# Patient Record
Sex: Female | Born: 1980 | Race: White | Hispanic: No | Smoking: Current every day smoker
Health system: Southern US, Community
[De-identification: ages and names within clinical notes are randomized; demographics above are authoritative.]

## PROBLEM LIST (undated history)

## (undated) DIAGNOSIS — N189 Chronic kidney disease, unspecified: Secondary | ICD-10-CM

## (undated) DIAGNOSIS — T7840XA Allergy, unspecified, initial encounter: Secondary | ICD-10-CM

## (undated) DIAGNOSIS — O1490 Unspecified pre-eclampsia, unspecified trimester: Secondary | ICD-10-CM

## (undated) HISTORY — PX: TOOTH EXTRACTION: SUR596

## (undated) HISTORY — DX: Allergy, unspecified, initial encounter: T78.40XA

## (undated) HISTORY — DX: Unspecified pre-eclampsia, unspecified trimester: O14.90

## (undated) HISTORY — DX: Chronic kidney disease, unspecified: N18.9

---
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
## 2003-10-12 ENCOUNTER — Other Ambulatory Visit: Admission: RE | Admit: 2003-10-12 | Discharge: 2003-10-12 | Payer: Self-pay | Admitting: Obstetrics and Gynecology

## 2006-10-31 ENCOUNTER — Other Ambulatory Visit: Admission: RE | Admit: 2006-10-31 | Discharge: 2006-10-31 | Payer: Self-pay | Admitting: *Deleted

## 2007-01-10 DIAGNOSIS — N189 Chronic kidney disease, unspecified: Secondary | ICD-10-CM

## 2007-01-10 HISTORY — DX: Chronic kidney disease, unspecified: N18.9

## 2008-01-07 ENCOUNTER — Encounter: Admission: RE | Admit: 2008-01-07 | Discharge: 2008-01-07 | Payer: Self-pay | Admitting: Nephrology

## 2008-04-09 ENCOUNTER — Inpatient Hospital Stay (HOSPITAL_COMMUNITY): Admission: AD | Admit: 2008-04-09 | Discharge: 2008-04-09 | Payer: Self-pay | Admitting: Obstetrics & Gynecology

## 2008-04-11 ENCOUNTER — Inpatient Hospital Stay (HOSPITAL_COMMUNITY): Admission: AD | Admit: 2008-04-11 | Discharge: 2008-04-11 | Payer: Self-pay | Admitting: Obstetrics

## 2008-04-16 ENCOUNTER — Inpatient Hospital Stay (HOSPITAL_COMMUNITY): Admission: AD | Admit: 2008-04-16 | Discharge: 2008-04-16 | Payer: Self-pay | Admitting: Obstetrics

## 2008-04-17 ENCOUNTER — Inpatient Hospital Stay (HOSPITAL_COMMUNITY): Admission: AD | Admit: 2008-04-17 | Discharge: 2008-04-17 | Payer: Self-pay | Admitting: Obstetrics

## 2008-04-23 ENCOUNTER — Inpatient Hospital Stay (HOSPITAL_COMMUNITY): Admission: AD | Admit: 2008-04-23 | Discharge: 2008-04-26 | Payer: Self-pay | Admitting: Obstetrics & Gynecology

## 2008-05-13 ENCOUNTER — Inpatient Hospital Stay (HOSPITAL_COMMUNITY): Admission: AD | Admit: 2008-05-13 | Discharge: 2008-05-16 | Payer: Self-pay | Admitting: Obstetrics and Gynecology

## 2009-08-29 IMAGING — US US RENAL
1 series · 14 of 25 positions shown · non-contrast
Comparison: None

CLINICAL DATA: Proteinuria

RENAL/URINARY TRACT ULTRASOUND
TECHNIQUE: Complete ultrasound examination of the urinary tract
was performed including evaluation of the kidneys, renal collecting
systems, and urinary bladder.

[Series 1: us renal · 0.22mm/px · 14 of 34 slices shown]
[im 1/34]
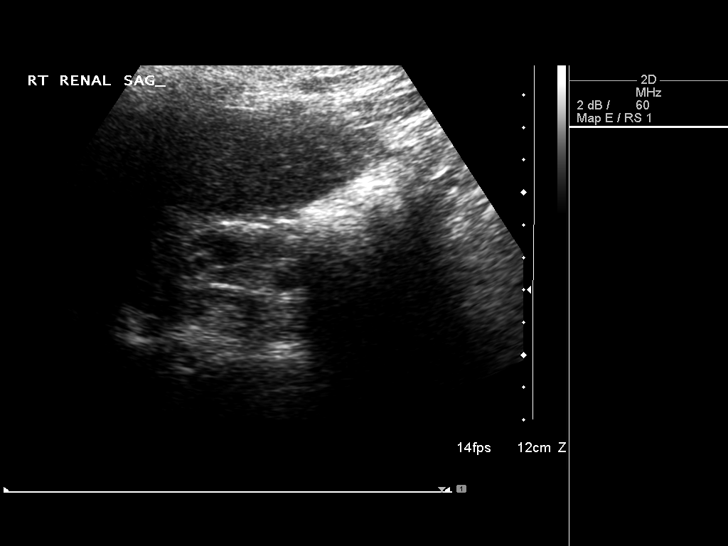
[im 3/34]
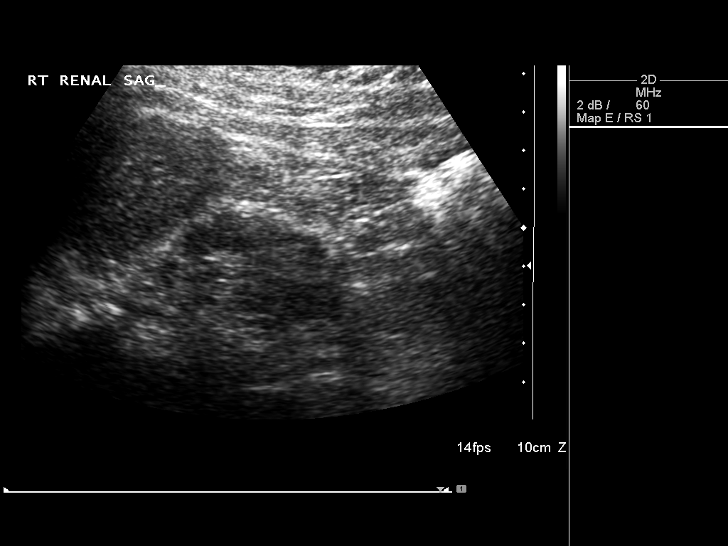
[im 6/34]
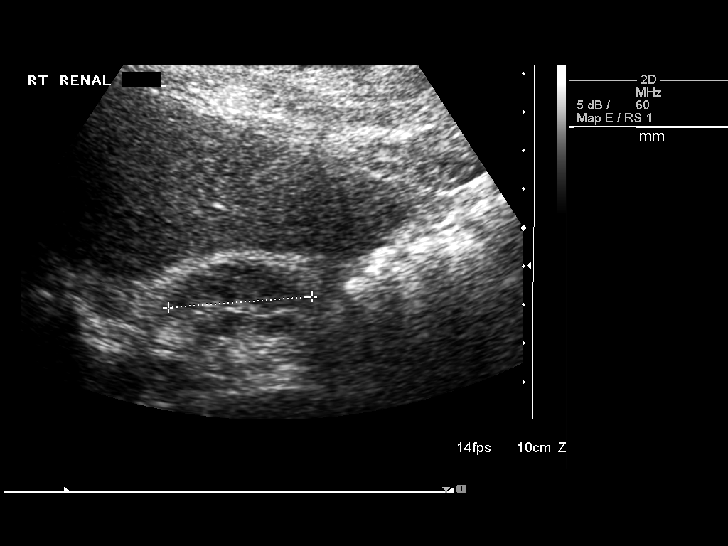
[im 9/34]
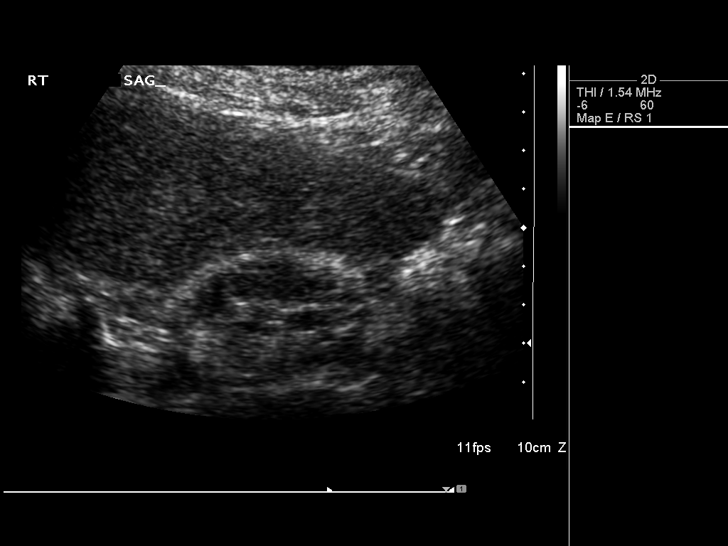
[im 12/34]
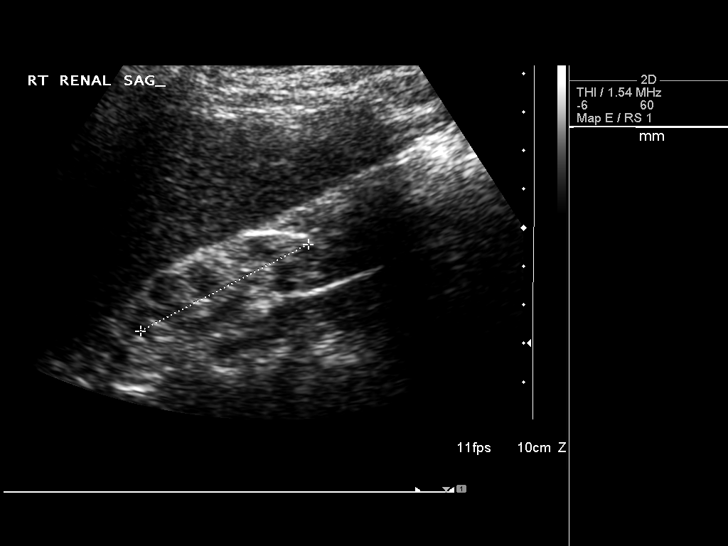
[im 13/34]
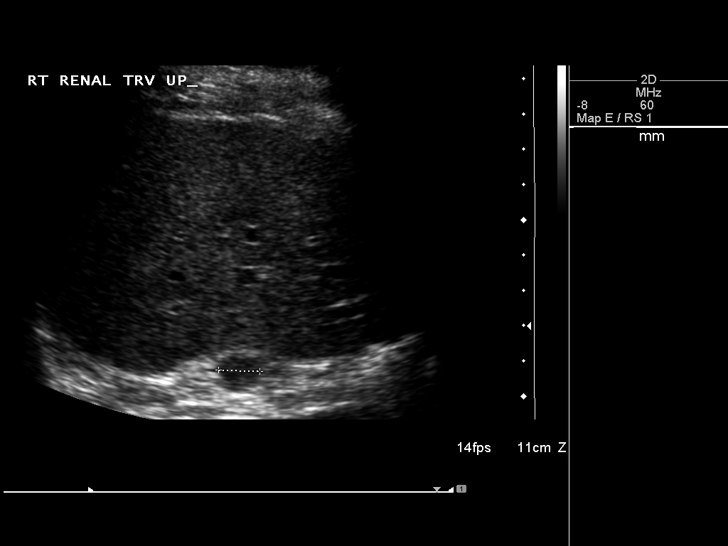
[im 16/34]
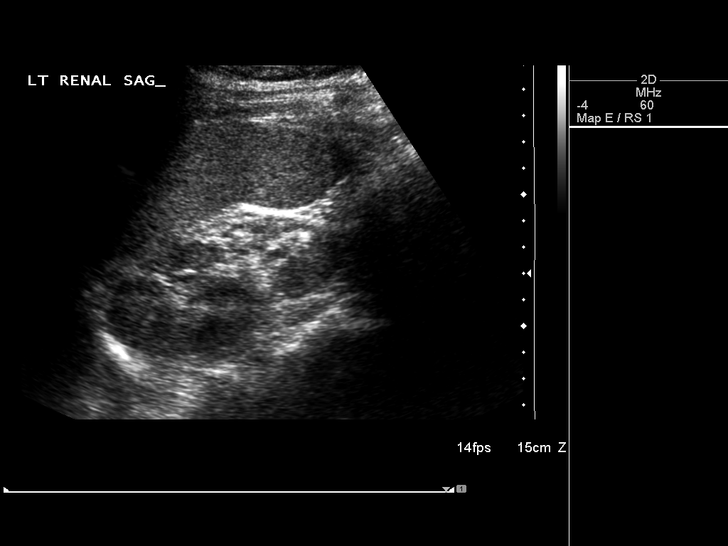
[im 18/34]
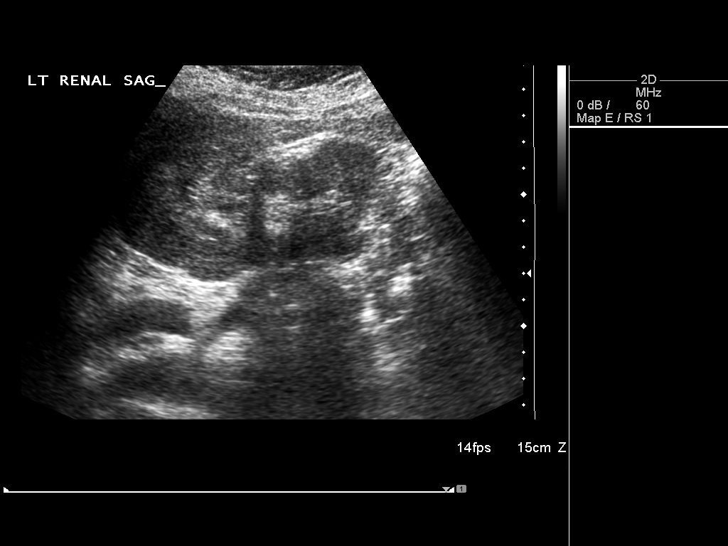
[im 21/34]
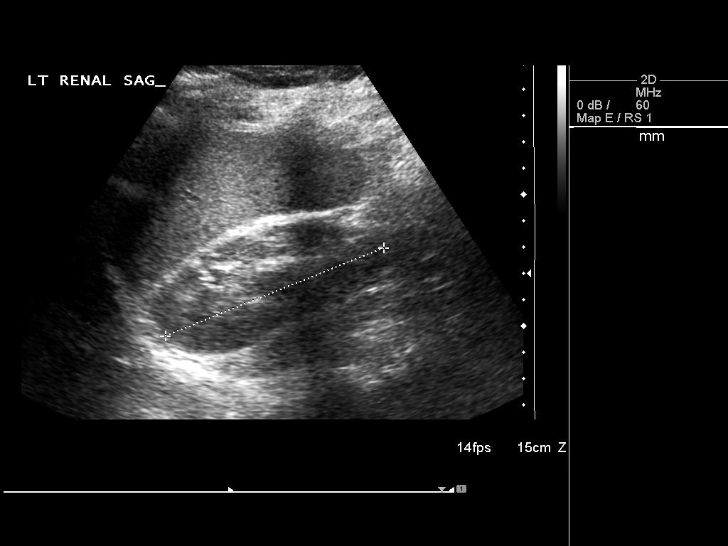
[im 23/34]
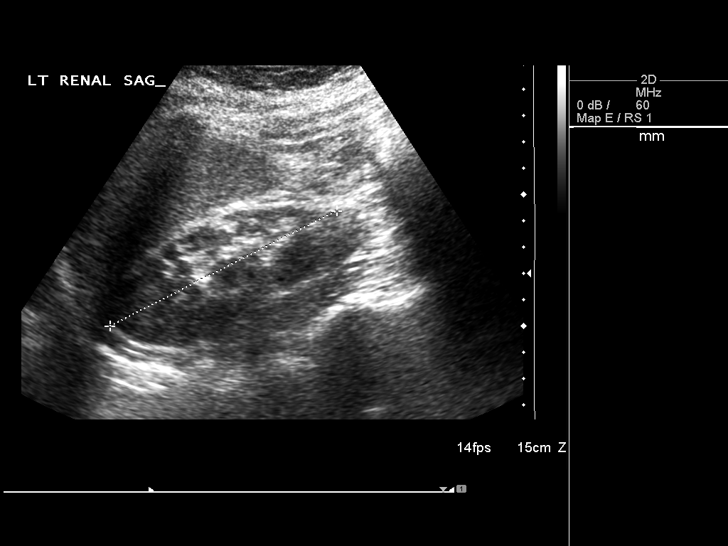
[im 25/34]
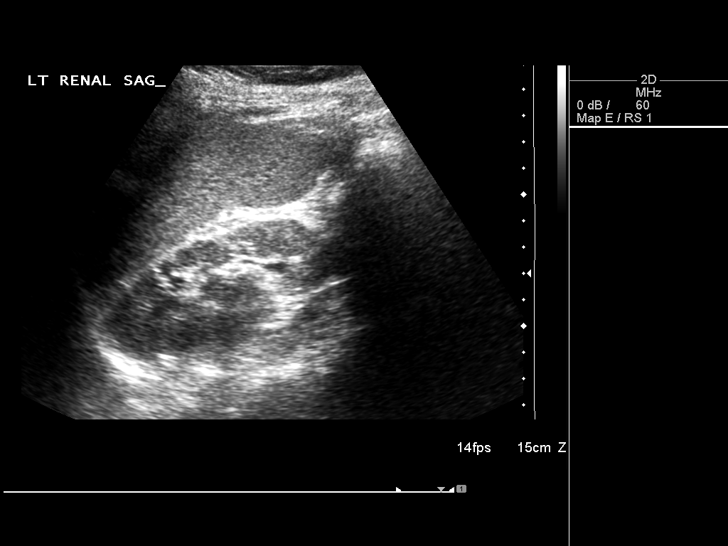
[im 28/34]
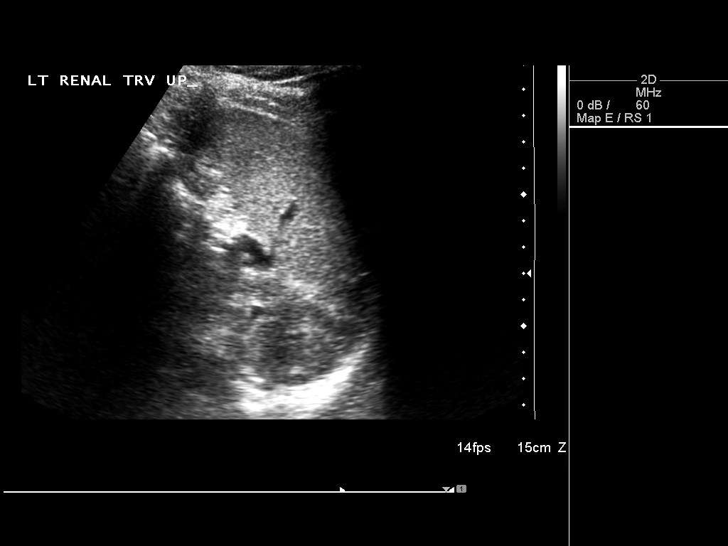
[im 31/34]
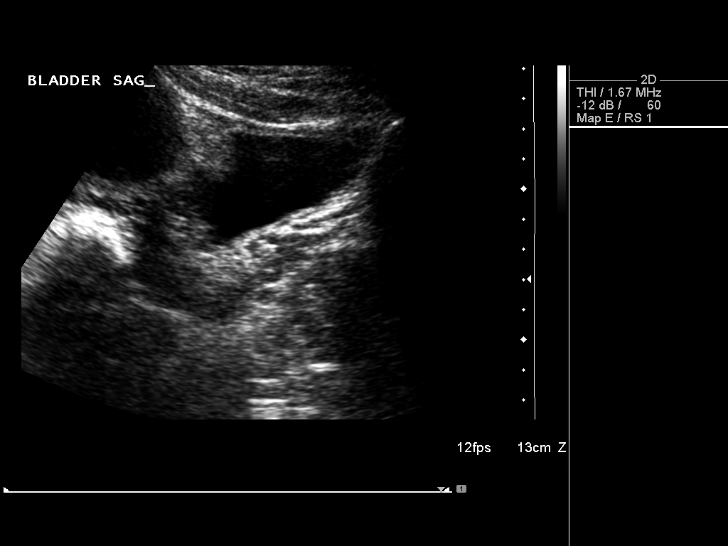
[im 34/34]
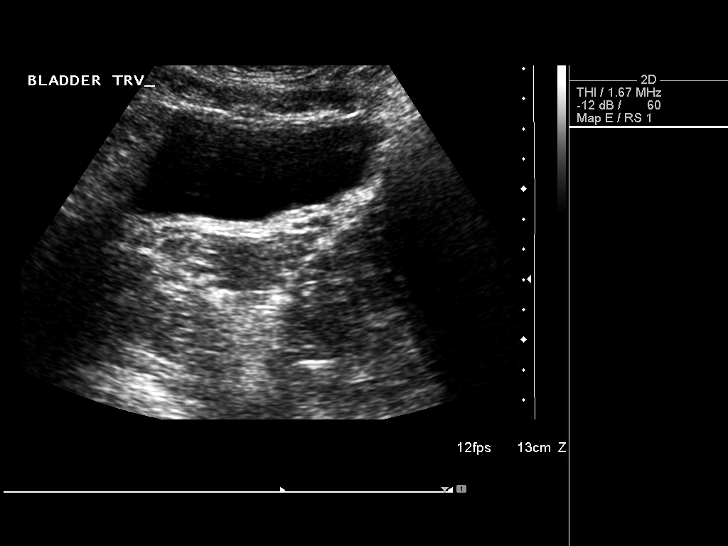

[14 of 25 positions shown; findings below may reference images not displayed]

FINDINGS: The kidneys appear very small and echogenic consistent
with chronic renal medical disease and atrophy.  The right kidney
measures 4.9 cm sagittally with left kidney measuring 9.0 cm.  No
hydronephrosis is seen.  A cyst is noted in the upper pole of the
right kidney of 1.1 x 1.1 x 1.2 cm.  The urinary bladder is
unremarkable although not well distended.
IMPRESSION: Small and echogenic kidneys consistent with chronic renal medical
disease and atrophy as noted above.

## 2010-04-19 LAB — COMPREHENSIVE METABOLIC PANEL
Alkaline Phosphatase: 110 U/L (ref 39–117)
Alkaline Phosphatase: 83 U/L (ref 39–117)
BUN: 13 mg/dL (ref 6–23)
BUN: 15 mg/dL (ref 6–23)
CO2: 23 mEq/L (ref 19–32)
Chloride: 105 mEq/L (ref 96–112)
Creatinine, Ser: 1.26 mg/dL — ABNORMAL HIGH (ref 0.4–1.2)
GFR calc non Af Amer: 51 mL/min — ABNORMAL LOW (ref >60)
GFR calc non Af Amer: 55 mL/min — ABNORMAL LOW (ref >60)
Glucose, Bld: 71 mg/dL (ref 70–99)
Glucose, Bld: 83 mg/dL (ref 70–99)
Potassium: 4.6 mEq/L (ref 3.5–5.1)
Total Bilirubin: 0.1 mg/dL — ABNORMAL LOW (ref 0.3–1.2)
Total Protein: 5.4 g/dL — ABNORMAL LOW (ref 6.0–8.3)

## 2010-04-19 LAB — CBC
HCT: 27.9 % — ABNORMAL LOW (ref 36.0–46.0)
HCT: 32.7 % — ABNORMAL LOW (ref 36.0–46.0)
Hemoglobin: 11.5 g/dL — ABNORMAL LOW (ref 12.0–15.0)
Hemoglobin: 9.9 g/dL — ABNORMAL LOW (ref 12.0–15.0)
MCHC: 35.3 g/dL (ref 30.0–36.0)
MCHC: 35.5 g/dL (ref 30.0–36.0)
MCV: 95.9 fL (ref 78.0–100.0)
MCV: 96.2 fL (ref 78.0–100.0)
Platelets: 209 10*3/uL (ref 150–400)
RBC: 2.91 MIL/uL — ABNORMAL LOW (ref 3.87–5.11)
RBC: 3.22 MIL/uL — ABNORMAL LOW (ref 3.87–5.11)
RBC: 3.54 MIL/uL — ABNORMAL LOW (ref 3.87–5.11)
RDW: 13.6 % (ref 11.5–15.5)
RDW: 13.9 % (ref 11.5–15.5)
WBC: 13.3 10*3/uL — ABNORMAL HIGH (ref 4.0–10.5)
WBC: 15.8 10*3/uL — ABNORMAL HIGH (ref 4.0–10.5)

## 2010-04-19 LAB — RH IMMUNE GLOB WKUP(>/=20WKS)(NOT WOMEN'S HOSP): Fetal Screen: NEGATIVE

## 2010-04-19 LAB — LACTATE DEHYDROGENASE: LDH: 147 U/L (ref 94–250)

## 2010-04-19 LAB — RPR: RPR Ser Ql: NONREACTIVE

## 2010-04-20 LAB — CBC
HCT: 32.1 % — ABNORMAL LOW (ref 36.0–46.0)
Hemoglobin: 10.6 g/dL — ABNORMAL LOW (ref 12.0–15.0)
Hemoglobin: 11 g/dL — ABNORMAL LOW (ref 12.0–15.0)
MCHC: 34.3 g/dL (ref 30.0–36.0)
MCHC: 35.1 g/dL (ref 30.0–36.0)
MCV: 95.1 fL (ref 78.0–100.0)
MCV: 96.1 fL (ref 78.0–100.0)
MCV: 96.2 fL (ref 78.0–100.0)
Platelets: 159 10*3/uL (ref 150–400)
RBC: 3.17 MIL/uL — ABNORMAL LOW (ref 3.87–5.11)
RBC: 3.26 MIL/uL — ABNORMAL LOW (ref 3.87–5.11)
RBC: 3.38 MIL/uL — ABNORMAL LOW (ref 3.87–5.11)
WBC: 10.4 10*3/uL (ref 4.0–10.5)
WBC: 10.9 10*3/uL — ABNORMAL HIGH (ref 4.0–10.5)
WBC: 11.4 10*3/uL — ABNORMAL HIGH (ref 4.0–10.5)

## 2010-04-20 LAB — COMPREHENSIVE METABOLIC PANEL
ALT: 13 U/L (ref 0–35)
AST: 17 U/L (ref 0–37)
AST: 16 U/L (ref 0–37)
AST: 17 U/L (ref 0–37)
Albumin: 2.4 g/dL — ABNORMAL LOW (ref 3.5–5.2)
Albumin: 2.6 g/dL — ABNORMAL LOW (ref 3.5–5.2)
Alkaline Phosphatase: 88 U/L (ref 39–117)
BUN: 9 mg/dL (ref 6–23)
CO2: 22 mEq/L (ref 19–32)
CO2: 22 mEq/L (ref 19–32)
CO2: 25 mEq/L (ref 19–32)
Calcium: 8.3 mg/dL — ABNORMAL LOW (ref 8.4–10.5)
Calcium: 8.4 mg/dL (ref 8.4–10.5)
Chloride: 106 mEq/L (ref 96–112)
Chloride: 105 mEq/L (ref 96–112)
Chloride: 106 mEq/L (ref 96–112)
Creatinine, Ser: 1.24 mg/dL — ABNORMAL HIGH (ref 0.4–1.2)
Creatinine, Ser: 1.11 mg/dL (ref 0.4–1.2)
Creatinine, Ser: 1.12 mg/dL (ref 0.4–1.2)
Creatinine, Ser: 1.18 mg/dL (ref 0.4–1.2)
GFR calc Af Amer: >60 mL/min (ref >60)
GFR calc Af Amer: >60 mL/min (ref >60)
GFR calc non Af Amer: 52 mL/min — ABNORMAL LOW (ref >60)
GFR calc non Af Amer: 55 mL/min — ABNORMAL LOW (ref >60)
GFR calc non Af Amer: 59 mL/min — ABNORMAL LOW (ref >60)
Glucose, Bld: 113 mg/dL — ABNORMAL HIGH (ref 70–99)
Glucose, Bld: 77 mg/dL (ref 70–99)
Potassium: 4.2 mEq/L (ref 3.5–5.1)
Sodium: 133 mEq/L — ABNORMAL LOW (ref 135–145)
Total Bilirubin: 0.4 mg/dL (ref 0.3–1.2)
Total Bilirubin: 0.3 mg/dL (ref 0.3–1.2)
Total Bilirubin: 0.4 mg/dL (ref 0.3–1.2)

## 2010-04-20 LAB — URIC ACID: Uric Acid, Serum: 6.7 mg/dL (ref 2.4–7.0)

## 2010-04-20 LAB — LACTATE DEHYDROGENASE
LDH: 116 U/L (ref 94–250)
LDH: 137 U/L (ref 94–250)

## 2010-04-20 LAB — CREATININE CLEARANCE, URINE, 24 HOUR
Collection Interval-CRCL: 24 hours
Creatinine, Urine: 53.5 mg/dL
Creatinine: 1.12 mg/dL (ref 0.40–1.20)
Urine Total Volume-CRCL: 1500 mL

## 2010-04-20 LAB — PROTEIN, URINE, 24 HOUR
Collection Interval-UPROT: 24 hours
Collection Interval-UPROT: 24 hours
Protein, 24H Urine: 1545 mg/d — ABNORMAL HIGH (ref 50–100)
Protein, 24H Urine: 855 mg/d — ABNORMAL HIGH (ref 50–100)
Protein, Urine: 60 mg/dL
Urine Total Volume-UPROT: 1500 mL

## 2010-05-27 NOTE — Discharge Summary (Signed)
[name], [name]              ACCOUNT NO.:  [id number]   MEDICAL RECORD NO.:  [id number]          PATIENT TYPE:  INP   LOCATION:  9151                          FACILITY:  [facility]   PHYSICIAN:  Ala Dach, MD   DATE OF BIRTH:  1980-10-24   DATE OF ADMISSION:  04/23/2008  DATE OF DISCHARGE:  04/26/2008                               DISCHARGE SUMMARY   CHIEF COMPLAINT:  Preeclampsia.   HISTORY OF PRESENT ILLNESS:  This is a 30 year old G1 admitted at 33  weeks and 6 days for evaluation and monitoring of mild preeclampsia.  The patient does have a history of baseline mild renal disease and has  had progressive increase in proteinuria in her third trimester.  She  also has had mildly elevated blood pressure.  She does note headache  4/10 but has tried no p.o. pain medicines.  She does note occasional  blurry vision but no scotomata, no right upper quadrant pain.  She is  having good fetal movement, no leakage of fluid, and no vaginal  bleeding.  Her prenatal issues are significant for the chronic renal  disease status post her consultation with Dr. Florene Glen from nephrology.  Baseline 24-hour urine of 591 mg.  Renal ultrasound showing small  echogenic kidneys with atrophy, negative C3-C4, and a creatinine of  1.23.  Given her baseline renal disease, baseline PIH labs were done.  Over the 3rd trimester it was notes that her blood pressures has been  rising.  She was noted to have some preterm cervical change at 33 weeks.  Fetal fibronectin was negative, but she did receive betamethasone.  Fetal testing has showed reactive testing with appropriate growth.  She  was O negative status post RhoGAM at 28 weeks and will need rubella shot  postpartum.  Remainder of her past medical, surgical, and obstetrical  history is as per her admission H&P.   HOSPITAL COURSE:  The patient was admitted for maternal and fetal  monitoring given her proteinuria and blood pressures.  On admission, a  creatinine was noted to be 1.18 with the uric acid of 7.2.  On hospital  day #2, the patient did note some blurry vision.  She also met with  Social Work.  By hospital day #3, her headache had resolved, her blood  pressures were somewhat normalized with systolics in the [id number] to the  [id number] and diastolics 123456 to the high 80s.  Fetal testing remained  reactive throughout.  A repeat 24-hour urine was being collected while  she was in the hospital.  By hospital day #4, the patient was again  stable, vitals improved with rest.  Fetal ultrasound showed an AFI of  11.7, fetal NST remained reactive.  Her [place] labs were essentially stable  as far as her 24-hour urine and the decision was made to discharge the  patient home with twice weekly labs and fetal and maternal assessment as  an outpatient.  The patient was willing to comply with the  recommendations and was discharged home.   DISCHARGE DIAGNOSES:  1. Chronic renal disease.  2. Mild preeclampsia.   DISCHARGE  CONDITION:  Stable.   DISCHARGE DISPOSITION:  To home.   DISCHARGE MEDICATIONS:  Prenatal vitamins, Zantac as needed.      Ala Dach, MD  Electronically Signed     KAF/MEDQ  D:  06/24/2008  T:  06/25/2008  Job:  [id number]

## 2010-12-27 ENCOUNTER — Ambulatory Visit (INDEPENDENT_AMBULATORY_CARE_PROVIDER_SITE_OTHER): Payer: BC Managed Care – PPO

## 2010-12-27 DIAGNOSIS — R05 Cough: Secondary | ICD-10-CM

## 2010-12-27 DIAGNOSIS — G4700 Insomnia, unspecified: Secondary | ICD-10-CM

## 2010-12-27 DIAGNOSIS — F4321 Adjustment disorder with depressed mood: Secondary | ICD-10-CM

## 2010-12-27 DIAGNOSIS — M545 Low back pain: Secondary | ICD-10-CM

## 2016-02-17 ENCOUNTER — Ambulatory Visit (INDEPENDENT_AMBULATORY_CARE_PROVIDER_SITE_OTHER): Payer: BC Managed Care – PPO | Admitting: Emergency Medicine

## 2016-02-17 VITALS — BP 132/88 | HR 122 | Temp 99.6°F | Wt 161.8 lb

## 2016-02-17 DIAGNOSIS — R112 Nausea with vomiting, unspecified: Secondary | ICD-10-CM

## 2016-02-17 DIAGNOSIS — B349 Viral infection, unspecified: Secondary | ICD-10-CM

## 2016-02-17 DIAGNOSIS — M791 Myalgia, unspecified site: Secondary | ICD-10-CM

## 2016-02-17 LAB — POCT URINALYSIS DIP (MANUAL ENTRY)
Bilirubin, UA: NEGATIVE
GLUCOSE UA: NEGATIVE
Ketones, POC UA: NEGATIVE
LEUKOCYTES UA: NEGATIVE
NITRITE UA: NEGATIVE
Spec Grav, UA: 1.02
UROBILINOGEN UA: 0.2
pH, UA: 6

## 2016-02-17 LAB — POCT CBC
GRANULOCYTE PERCENT: 91.5 % — AB (ref 37–80)
HCT, POC: 44.4 % (ref 37.7–47.9)
Hemoglobin: 15.6 g/dL (ref 12.2–16.2)
LYMPH, POC: 0.5 — AB (ref 0.6–3.4)
MCH, POC: 32.3 pg — AB (ref 27–31.2)
MCHC: 35.1 g/dL (ref 31.8–35.4)
MCV: 92.2 fL (ref 80–97)
MID (cbc): 0.3 (ref 0–0.9)
MPV: 7.6 fL (ref 0–99.8)
POC Granulocyte: 8.7 — AB (ref 2–6.9)
POC LYMPH %: 5.7 % — AB (ref 10–50)
POC MID %: 2.8 %M (ref 0–12)
Platelet Count, POC: 148 10*3/uL (ref 142–424)
RBC: 4.82 M/uL (ref 4.04–5.48)
RDW, POC: 12.5 %
WBC: 9.5 10*3/uL (ref 4.6–10.2)

## 2016-02-17 LAB — POCT INFLUENZA A/B
INFLUENZA A, POC: NEGATIVE
INFLUENZA B, POC: NEGATIVE

## 2016-02-17 MED ORDER — OSELTAMIVIR PHOSPHATE 75 MG PO CAPS: 75.0000 mg | ORAL_CAPSULE | Freq: Two times a day (BID) | ORAL | 0 refills | Status: AC

## 2016-02-17 MED ORDER — HYDROCODONE-ACETAMINOPHEN 5-325 MG PO TABS: 1.0000 | ORAL_TABLET | Freq: Four times a day (QID) | ORAL | 0 refills | Status: DC | PRN

## 2016-02-17 MED ORDER — ONDANSETRON HCL 4 MG PO TABS: 4.0000 mg | ORAL_TABLET | Freq: Three times a day (TID) | ORAL | 0 refills | Status: DC | PRN

## 2016-02-17 NOTE — Patient Instructions (Addendum)
IF you received an x-ray today, you will receive an invoice from [facility]. Please contact [facility] at [phone] with questions or concerns regarding your invoice.   IF you received labwork today, you will receive an invoice from [facility]. Please contact [facility] at [phone] with questions or concerns regarding your invoice.   Our billing staff will not be able to assist you with questions regarding bills from these companies.  You will be contacted with the lab results as soon as they are available. The fastest way to get your results is to activate your My Chart account. Instructions are located on the last page of this paperwork. If you have not heard from [place] regarding the results in 2 weeks, please contact this office.      Viral Illness, Adult Viruses are tiny germs that can get into a person's body and cause illness. There are many different types of viruses, and they cause many types of illness. Viral illnesses can range from mild to severe. They can affect various parts of the body. Common illnesses that are caused by a virus include colds and the flu. Viral illnesses also include serious conditions such as HIV/AIDS (human immunodeficiency virus/acquired immunodeficiency syndrome). A few viruses have been linked to certain cancers. What are the causes? Many types of viruses can cause illness. Viruses invade cells in your body, multiply, and cause the infected cells to malfunction or die. When the cell dies, it releases more of the virus. When this happens, you develop symptoms of the illness, and the virus continues to spread to other cells. If the virus takes over the function of the cell, it can cause the cell to divide and grow out of control, as is the case when a virus causes cancer. Different viruses get into the body in different ways. You can get a virus by:  Swallowing food or water that is contaminated with the virus.  Breathing in droplets  that have been coughed or sneezed into the air by an infected person.  Touching a surface that has been contaminated with the virus and then touching your eyes, nose, or mouth.  Being bitten by an insect or animal that carries the virus.  Having sexual contact with a person who is infected with the virus.  Being exposed to blood or fluids that contain the virus, either through an open cut or during a transfusion. If a virus enters your body, your body's defense system (immune system) will try to fight the virus. You may be at higher risk for a viral illness if your immune system is weak. What are the signs or symptoms? Symptoms vary depending on the type of virus and the location of the cells that it invades. Common symptoms of the main types of viral illnesses include: Cold and flu viruses  Fever.  Headache.  Sore throat.  Muscle aches.  Nasal congestion.  Cough. Digestive system (gastrointestinal) viruses  Fever.  Abdominal pain.  Nausea.  Diarrhea. Liver viruses (hepatitis)  Loss of appetite.  Tiredness.  Yellowing of the skin (jaundice). Brain and spinal cord viruses  Fever.  Headache.  Stiff neck.  Nausea and vomiting.  Confusion or sleepiness. Skin viruses  Warts.  Itching.  Rash. Sexually transmitted viruses  Discharge.  Swelling.  Redness.  Rash. How is this treated? Viruses can be difficult to treat because they live within cells. Antibiotic medicines do not treat viruses because these drugs do not get inside cells. Treatment for a viral illness  may include:  Resting and drinking plenty of fluids.  Medicines to relieve symptoms. These can include over-the-counter medicine for pain and fever, medicines for cough or congestion, and medicines to relieve diarrhea.  Antiviral medicines. These drugs are available only for certain types of viruses. They may help reduce flu symptoms if taken early. There are also many antiviral medicines for  hepatitis and HIV/AIDS. Some viral illnesses can be prevented with vaccinations. A common example is the flu shot. Follow these instructions at home: Medicines  Take over-the-counter and prescription medicines only as told by your health care provider.  If you were prescribed an antiviral medicine, take it as told by your health care provider. Do not stop taking the medicine even if you start to feel better.  Be aware of when antibiotics are needed and when they are not needed. Antibiotics do not treat viruses. If your health care provider thinks that you may have a bacterial infection as well as a viral infection, you may get an antibiotic.  Do not ask for an antibiotic prescription if you have been diagnosed with a viral illness. That will not make your illness go away faster.  Frequently taking antibiotics when they are not needed can lead to antibiotic resistance. When this develops, the medicine no longer works against the bacteria that it normally fights. General instructions  Drink enough fluids to keep your urine clear or pale yellow.  Rest as much as possible.  Return to your normal activities as told by your health care provider. Ask your health care provider what activities are safe for you.  Keep all follow-up visits as told by your health care provider. This is important. How is this prevented? Take these actions to reduce your risk of viral infection:  Eat a healthy diet and get enough rest.  Wash your hands often with soap and water. This is especially important when you are in public places. If soap and water are not available, use hand sanitizer.  Avoid close contact with friends and family who have a viral illness.  If you travel to areas where viral gastrointestinal infection is common, avoid drinking water or eating raw food.  Keep your immunizations up to date. Get a flu shot every year as told by your health care provider.  Do not share toothbrushes, nail  clippers, razors, or needles with other people.  Always practice safe sex. Contact a health care provider if:  You have symptoms of a viral illness that do not go away.  Your symptoms come back after going away.  Your symptoms get worse. Get help right away if:  You have trouble breathing.  You have a severe headache or a stiff neck.  You have severe vomiting or abdominal pain. This information is not intended to replace advice given to you by your health care provider. Make sure you discuss any questions you have with your health care provider. Document Released: 05/07/2015 Document Revised: 06/09/2015 Document Reviewed: 05/07/2015 Elsevier Interactive Patient Education  2017 [employer].

## 2016-02-17 NOTE — Progress Notes (Signed)
[name] 36 y.o.   Chief Complaint  Patient presents with  . Emesis  . Chills  . Headache    HISTORY OF PRESENT ILLNESS: This is a 36 y.o. female complaining of nausea, vomiting, chills, and headache that started today 6am.  HPI   Prior to Admission medications   Not on File    No Known Allergies  There are no active problems to display for this patient.   History reviewed. No pertinent past medical history.  History reviewed. No pertinent surgical history.  Social History   Social History  . Marital status: Married    Spouse name: N/A  . Number of children: N/A  . Years of education: N/A   Occupational History  . Not on file.   Social History Main Topics  . Smoking status: Current Every Day Smoker    Packs/day: 0.25    Types: Cigarettes  . Smokeless tobacco: Never Used  . Alcohol use Not on file  . Drug use: Unknown  . Sexual activity: Not on file   Other Topics Concern  . Not on file   Social History Narrative  . No narrative on file    History reviewed. No pertinent family history.   Review of Systems  Constitutional: Positive for chills and malaise/fatigue. Negative for fever.  HENT: Negative for congestion, nosebleeds and sore throat.   Eyes: Negative for discharge and redness.  Respiratory: Negative for cough, shortness of breath and wheezing.   Cardiovascular: Negative for chest pain, palpitations and leg swelling.  Gastrointestinal: Positive for nausea and vomiting. Negative for abdominal pain, constipation and diarrhea.  Genitourinary: Negative for dysuria, flank pain and hematuria.  Musculoskeletal: Positive for back pain and myalgias. Negative for neck pain.  Skin: Negative for rash.  Neurological: Positive for headaches. Negative for dizziness, sensory change and focal weakness.  All other systems reviewed and are negative.  Vitals:   02/17/16 1513  BP: 132/88  Pulse: (!) 122  Temp: 99.6 F (37.6 C)     Physical  Exam  Constitutional: She is oriented to person, place, and time. She appears well-developed and well-nourished.  HENT:  Head: Normocephalic and atraumatic.  Nose: Nose normal.  Mouth/Throat: Oropharynx is clear and moist. No oropharyngeal exudate.  Eyes: Conjunctivae and EOM are normal. Pupils are equal, round, and reactive to light.  Neck: Normal range of motion. No JVD present. No thyromegaly present.  Cardiovascular: Normal rate, regular rhythm and normal heart sounds.   Pulmonary/Chest: Effort normal and breath sounds normal.  Abdominal: Soft. Bowel sounds are normal. She exhibits no distension. There is no tenderness.  Musculoskeletal: Normal range of motion.  Lymphadenopathy:    She has no cervical adenopathy.  Neurological: She is alert and oriented to person, place, and time. No sensory deficit. She exhibits normal muscle tone.  Skin: Skin is warm and dry. Capillary refill takes less than 2 seconds. No rash noted.  Psychiatric: She has a normal mood and affect. Her behavior is normal.  Vitals reviewed.    ASSESSMENT & PLAN: [name] was seen today for emesis, chills and headache.  Diagnoses and all orders for this visit:  Nausea and vomiting, intractability of vomiting not specified, unspecified vomiting type -     POCT Influenza A/B -     POCT CBC -     POCT urinalysis dipstick  Viral illness Comments: suspected flu Orders: -     POCT Influenza A/B -     POCT CBC -  POCT urinalysis dipstick  Generalized muscle ache  Other orders -     oseltamivir (TAMIFLU) 75 MG capsule; Take 1 capsule (75 mg total) by mouth 2 (two) times daily. -     ondansetron (ZOFRAN) 4 MG tablet; Take 1 tablet (4 mg total) by mouth every 8 (eight) hours as needed for nausea or vomiting. -     HYDROcodone-acetaminophen (NORCO) 5-325 MG tablet; Take 1 tablet by mouth every 6 (six) hours as needed for moderate pain.    Patient Instructions       IF you received an x-ray today, you  will receive an invoice from [facility]. Please contact [facility] at [phone] with questions or concerns regarding your invoice.   IF you received labwork today, you will receive an invoice from [facility]. Please contact [facility] at [phone] with questions or concerns regarding your invoice.   Our billing staff will not be able to assist you with questions regarding bills from these companies.  You will be contacted with the lab results as soon as they are available. The fastest way to get your results is to activate your My Chart account. Instructions are located on the last page of this paperwork. If you have not heard from [place] regarding the results in 2 weeks, please contact this office.      Viral Illness, Adult Viruses are tiny germs that can get into a person's body and cause illness. There are many different types of viruses, and they cause many types of illness. Viral illnesses can range from mild to severe. They can affect various parts of the body. Common illnesses that are caused by a virus include colds and the flu. Viral illnesses also include serious conditions such as HIV/AIDS (human immunodeficiency virus/acquired immunodeficiency syndrome). A few viruses have been linked to certain cancers. What are the causes? Many types of viruses can cause illness. Viruses invade cells in your body, multiply, and cause the infected cells to malfunction or die. When the cell dies, it releases more of the virus. When this happens, you develop symptoms of the illness, and the virus continues to spread to other cells. If the virus takes over the function of the cell, it can cause the cell to divide and grow out of control, as is the case when a virus causes cancer. Different viruses get into the body in different ways. You can get a virus by:  Swallowing food or water that is contaminated with the virus.  Breathing in droplets that have been coughed or sneezed into the  air by an infected person.  Touching a surface that has been contaminated with the virus and then touching your eyes, nose, or mouth.  Being bitten by an insect or animal that carries the virus.  Having sexual contact with a person who is infected with the virus.  Being exposed to blood or fluids that contain the virus, either through an open cut or during a transfusion. If a virus enters your body, your body's defense system (immune system) will try to fight the virus. You may be at higher risk for a viral illness if your immune system is weak. What are the signs or symptoms? Symptoms vary depending on the type of virus and the location of the cells that it invades. Common symptoms of the main types of viral illnesses include: Cold and flu viruses  Fever.  Headache.  Sore throat.  Muscle aches.  Nasal congestion.  Cough. Digestive system (gastrointestinal) viruses  Fever.  Abdominal  pain.  Nausea.  Diarrhea. Liver viruses (hepatitis)  Loss of appetite.  Tiredness.  Yellowing of the skin (jaundice). Brain and spinal cord viruses  Fever.  Headache.  Stiff neck.  Nausea and vomiting.  Confusion or sleepiness. Skin viruses  Warts.  Itching.  Rash. Sexually transmitted viruses  Discharge.  Swelling.  Redness.  Rash. How is this treated? Viruses can be difficult to treat because they live within cells. Antibiotic medicines do not treat viruses because these drugs do not get inside cells. Treatment for a viral illness may include:  Resting and drinking plenty of fluids.  Medicines to relieve symptoms. These can include over-the-counter medicine for pain and fever, medicines for cough or congestion, and medicines to relieve diarrhea.  Antiviral medicines. These drugs are available only for certain types of viruses. They may help reduce flu symptoms if taken early. There are also many antiviral medicines for hepatitis and HIV/AIDS. Some viral  illnesses can be prevented with vaccinations. A common example is the flu shot. Follow these instructions at home: Medicines  Take over-the-counter and prescription medicines only as told by your health care provider.  If you were prescribed an antiviral medicine, take it as told by your health care provider. Do not stop taking the medicine even if you start to feel better.  Be aware of when antibiotics are needed and when they are not needed. Antibiotics do not treat viruses. If your health care provider thinks that you may have a bacterial infection as well as a viral infection, you may get an antibiotic.  Do not ask for an antibiotic prescription if you have been diagnosed with a viral illness. That will not make your illness go away faster.  Frequently taking antibiotics when they are not needed can lead to antibiotic resistance. When this develops, the medicine no longer works against the bacteria that it normally fights. General instructions  Drink enough fluids to keep your urine clear or pale yellow.  Rest as much as possible.  Return to your normal activities as told by your health care provider. Ask your health care provider what activities are safe for you.  Keep all follow-up visits as told by your health care provider. This is important. How is this prevented? Take these actions to reduce your risk of viral infection:  Eat a healthy diet and get enough rest.  Wash your hands often with soap and water. This is especially important when you are in public places. If soap and water are not available, use hand sanitizer.  Avoid close contact with friends and family who have a viral illness.  If you travel to areas where viral gastrointestinal infection is common, avoid drinking water or eating raw food.  Keep your immunizations up to date. Get a flu shot every year as told by your health care provider.  Do not share toothbrushes, nail clippers, razors, or needles with other  people.  Always practice safe sex. Contact a health care provider if:  You have symptoms of a viral illness that do not go away.  Your symptoms come back after going away.  Your symptoms get worse. Get help right away if:  You have trouble breathing.  You have a severe headache or a stiff neck.  You have severe vomiting or abdominal pain. This information is not intended to replace advice given to you by your health care provider. Make sure you discuss any questions you have with your health care provider. Document Released: 05/07/2015 Document Revised: 06/09/2015 Document  Reviewed: 05/07/2015 Elsevier Interactive Patient Education  2017 [employer]      Agustina Caroli, MD [facility]

## 2016-03-27 ENCOUNTER — Ambulatory Visit (INDEPENDENT_AMBULATORY_CARE_PROVIDER_SITE_OTHER): Payer: BC Managed Care – PPO | Admitting: Urgent Care

## 2016-03-27 VITALS — BP 126/82 | HR 86 | Temp 98.5°F | Resp 16 | Ht 62.0 in | Wt 163.2 lb

## 2016-03-27 DIAGNOSIS — R0789 Other chest pain: Secondary | ICD-10-CM

## 2016-03-27 DIAGNOSIS — H9203 Otalgia, bilateral: Secondary | ICD-10-CM | POA: Diagnosis not present

## 2016-03-27 DIAGNOSIS — J029 Acute pharyngitis, unspecified: Secondary | ICD-10-CM | POA: Diagnosis not present

## 2016-03-27 DIAGNOSIS — R059 Cough, unspecified: Secondary | ICD-10-CM

## 2016-03-27 DIAGNOSIS — J22 Unspecified acute lower respiratory infection: Secondary | ICD-10-CM

## 2016-03-27 DIAGNOSIS — R05 Cough: Secondary | ICD-10-CM

## 2016-03-27 DIAGNOSIS — F17200 Nicotine dependence, unspecified, uncomplicated: Secondary | ICD-10-CM

## 2016-03-27 MED ORDER — HYDROCODONE-HOMATROPINE 5-1.5 MG/5ML PO SYRP
5.0000 mL | ORAL_SOLUTION | Freq: Every evening | ORAL | 0 refills | Status: DC | PRN
Start: 2016-03-27 — End: 2016-04-06

## 2016-03-27 MED ORDER — BENZONATATE 100 MG PO CAPS: 100.0000 mg | ORAL_CAPSULE | Freq: Three times a day (TID) | ORAL | 0 refills | Status: DC | PRN

## 2016-03-27 MED ORDER — CETIRIZINE HCL 10 MG PO TABS: 10.0000 mg | ORAL_TABLET | Freq: Every day | ORAL | 11 refills | Status: DC

## 2016-03-27 MED ORDER — AZITHROMYCIN 250 MG PO TABS: ORAL_TABLET | ORAL | 0 refills | Status: DC

## 2016-03-27 MED ORDER — ALBUTEROL SULFATE HFA 108 (90 BASE) MCG/ACT IN AERS: 2.0000 | INHALATION_SPRAY | Freq: Four times a day (QID) | RESPIRATORY_TRACT | 1 refills | Status: DC | PRN

## 2016-03-27 NOTE — Patient Instructions (Addendum)
Salads - Kale, Spinach, Cabbage, Spring mix Fruits - Avocadoes, berries (blueberries, raspberries, blackberries), apples, oranges Seeds - Quinoa, Chia seeds Vegetables - aspargus, mashed cauliflower, broccoli, green beans, brussel spouts, bell peppers  Brussel sprouts - Cut off stems. Place in a mixing bowl that has a lid. Pour in a 1/4-1/2 cup olive oil, spices, use a light amount of parmesan. Place on a baking sheet. Bake for 10 minutes at 400F. Take it out, eat the brussel chips. Place for another 5-10 minutes.   Vega protein is good protein powder, make sure you use ~6 ice cubes to give it smoothie consistency together with ~4-6 ounces of vanilla soy milk. Throw cinnamon into your shake, use peanut butter.      Lorcaserin oral tablets What is this medicine? LORCASERIN (lor ca SER in) is used to promote and maintain weight loss in obese patients. This medicine should be used with a reduced calorie diet and, if appropriate, an exercise program. This medicine may be used for other purposes; ask your health care provider or pharmacist if you have questions. COMMON BRAND NAME(S): Belviq What should I tell my health care provider before I take this medicine? They need to know if you have any of these conditions: -anatomical deformation of the penis, Peyronie's disease, or history of priapism (painful and prolonged erection) -diabetes -heart disease -history of blood diseases, like sickle cell anemia or leukemia -history of irregular heartbeat -kidney disease -liver disease -suicidal thoughts, plans, or attempt; a previous suicide attempt by you or a family member -an unusual or allergic reaction to lorcaserin, other medicines, foods, dyes, or preservatives -pregnant or trying to get pregnant -breast-feeding How should I use this medicine? Take this medicine by mouth with a glass of water. Follow the directions on the prescription label. You can take it with or without food. Take your  medicine at regular intervals. Do not take it more often than directed. Do not stop taking except on your doctor's advice. Talk to your pediatrician regarding the use of this medicine in children. Special care may be needed. Overdosage: If you think you have taken too much of this medicine contact a poison control center or emergency room at once. NOTE: This medicine is only for you. Do not share this medicine with others. What if I miss a dose? If you miss a dose, take it as soon as you can. If it is almost time for your next dose, take only that dose. Do not take double or extra doses. What may interact with this medicine? -cabergoline -certain medicines for depression, anxiety, or psychotic disturbances -certain medicines for erectile dysfunction -certain medicines for migraine headache like almotriptan, eletriptan, frovatriptan, naratriptan, rizatriptan, sumatriptan, zolmitriptan -dextromethorphan -linezolid -lithium -medicines for diabetes -other weight loss products -tramadol -St. John's Wort -stimulant medicines for attention disorders, weight loss, or to stay awake -tryptophan This list may not describe all possible interactions. Give your health care provider a list of all the medicines, herbs, non-prescription drugs, or dietary supplements you use. Also tell them if you smoke, drink alcohol, or use illegal drugs. Some items may interact with your medicine. What should I watch for while using this medicine? This medicine is intended to be used in addition to a healthy diet and appropriate exercise. The best results are achieved this way. Your doctor should instruct you to stop taking this medicine if you do not lose a certain amount of weight within the first 12 weeks of treatment, but it is important that you  do not change your dose in any way without consulting your doctor or health care professional. Visit your doctor or health care professional for regular checkups. Your doctor may  order blood tests or other tests to see how you are doing. Do not drive, use machinery, or do anything that needs mental alertness until you know how this medicine affects you. This medicine may affect blood sugar levels. If you have diabetes, check with your doctor or health care professional before you change your diet or the dose of your diabetic medicine. Patients and their families should watch out for worsening depression or thoughts of suicide. Also watch out for sudden changes in feelings such as feeling anxious, agitated, panicky, irritable, hostile, aggressive, impulsive, severely restless, overly excited and hyperactive, or not being able to sleep. If this happens, especially at the beginning of treatment or after a change in dose, call your health care professional. Contact your doctor or health care professional right away if you are a man with an erection that lasts longer than 4 hours or if the erection becomes painful. This may be a sign of serious problem and must be treated right away to prevent permanent damage. What side effects may I notice from receiving this medicine? Side effects that you should report to your doctor or health care professional as soon as possible: -allergic reactions like skin rash, itching or hives, swelling of the face, lips, or tongue -abnormal production of milk -breast enlargement in both males and females -breathing problems -changes in emotions or moods -changes in vision -confusion -erection lasting more than 4 hours or a painful erection -fast or irregular heart beat -feeling faint or lightheaded, falls -fever or chills, sore throat -hallucination, loss of contact with reality -high or low blood pressure -menstrual changes -restlessness -slow or irregular heartbeat -stiff muscles -sweating -suicidal thoughts or other mood changes -swelling of the ankles, feet, hands -unusually weak or tired -vomiting Side effects that usually do not  require medical attention (report to your doctor or health care professional if they continue or are bothersome): -back pain -constipation -cough -dry mouth -nausea -tiredness This list may not describe all possible side effects. Call your doctor for medical advice about side effects. You may report side effects to FDA at 1-800-FDA-1088. Where should I keep my medicine? Keep out of the reach of children. This medicine can be abused. Keep your medicine in a safe place to protect it from theft. Do not share this medicine with anyone. Selling or giving away this medicine is dangerous and against the law. Store at room temperature between 15 and 30 degrees C (59 and 86 degrees F). Throw away any unused medicine after the expiration date. NOTE: This sheet is a summary. It may not cover all possible information. If you have questions about this medicine, talk to your doctor, pharmacist, or health care provider.  2018 Elsevier/Gold Standard (2015-01-28 12:13:31)     Health Risks of Smoking Smoking cigarettes is very bad for your health. Tobacco smoke has over 200 known poisons in it. It contains the poisonous gases nitrogen oxide and carbon monoxide. There are over 60 chemicals in tobacco smoke that cause cancer. Smoking is difficult to quit because a chemical in tobacco, called nicotine, causes addiction or dependence. When you smoke and inhale, nicotine is absorbed rapidly into the bloodstream through your lungs. Both inhaled and non-inhaled nicotine may be addictive. What are the risks of cigarette smoke? Cigarette smokers have an increased risk of many serious medical  problems, including:  Lung cancer.  Lung disease, such as pneumonia, bronchitis, and emphysema.  Chest pain (angina) and heart attack because the heart is not getting enough oxygen.  Heart disease and peripheral blood vessel disease.  High blood pressure (hypertension).  Stroke.  Oral cancer, including cancer of the  lip, mouth, or voice box.  Bladder cancer.  Pancreatic cancer.  Cervical cancer.  Pregnancy complications, including premature birth.  Stillbirths and smaller newborn babies, birth defects, and genetic damage to sperm.  Early menopause.  Lower estrogen level for women.  Infertility.  Facial wrinkles.  Blindness.  Increased risk of broken bones (fractures).  Senile dementia.  Stomach ulcers and internal bleeding.  Delayed wound healing and increased risk of complications during surgery.  Even smoking lightly shortens your life expectancy by several years. Because of secondhand smoke exposure, children of smokers have an increased risk of the following:  Sudden infant death syndrome (SIDS).  Respiratory infections.  Lung cancer.  Heart disease.  Ear infections. What are the benefits of quitting? There are many health benefits of quitting smoking. Here are some of them:  Within days of quitting smoking, your risk of having a heart attack decreases, your blood flow improves, and your lung capacity improves. Blood pressure, pulse rate, and breathing patterns start returning to normal soon after quitting.  Within months, your lungs may clear up completely.  Quitting for 10 years reduces your risk of developing lung cancer and heart disease to almost that of a nonsmoker.  People who quit may see an improvement in their overall quality of life. How do I quit smoking? Smoking is an addiction with both physical and psychological effects, and longtime habits can be hard to change. Your health care provider can recommend:  Programs and community resources, which may include group support, education, or talk therapy.  Prescription medicines to help reduce cravings.  Nicotine replacement products, such as patches, gum, and nasal sprays. Use these products only as directed. Do not replace cigarette smoking with electronic cigarettes, which are commonly called e-cigarettes.  The safety of e-cigarettes is not known, and some may contain harmful chemicals.  A combination of two or more of these methods. Where to find more information:  American Lung Association: www.lung.org  American Cancer Society: www.cancer.org Summary  Smoking cigarettes is very bad for your health. Cigarette smokers have an increased risk of many serious medical problems, including several cancers, heart disease, and stroke.  Smoking is an addiction with both physical and psychological effects, and longtime habits can be hard to change.  By stopping right away, you can greatly reduce the risk of medical problems for you and your family.  To help you quit smoking, your health care provider can recommend programs, community resources, prescription medicines, and nicotine replacement products such as patches, gum, and nasal sprays. This information is not intended to replace advice given to you by your health care provider. Make sure you discuss any questions you have with your health care provider. Document Released: 02/03/2004 Document Revised: 12/31/2015 Document Reviewed: 12/31/2015 Elsevier Interactive Patient Education  2017 [employer].

## 2016-03-27 NOTE — Progress Notes (Signed)
  MRN: [id number] DOB: [birth year]  Subjective:   [name] is a 36 y.o. female presenting for chief complaint of Sore Throat (x 1 week); Cough (x 1 wk); and nasal congestion  Cough - Reports 1 week history of worsening cough, sore throat. Cough elicits throat pain, chest pain, wheezing. Has tried Sudafed with minimal relief. Denies fever, ear drainage, n/v, abdominal pain, body aches, rashes. Denies history of allergies, asthma. Did not take flu shot this season. Smokes 1/2 ppd. Has quit before but is hesitant to do so again.  Weight - Reports that she is exercising but has not been able to do so this week. She has a hard time with her weight but tries to eat healthily. Her husband smokes and does not eat healthily either. She has a hard time with her schedule because she works excessively, has a daughter and husband that she takes care of. We discussed diet and exercise, as well as weight loss medication. She is hesitant to quit smoking because of weight gain she has experienced in the past. However, she would consider quitting if she could get help with weight loss.  Uldine has a current medication list which includes the following prescription(s): ondansetron. Also has No Known Allergies. Rhealyn history of eclampsia, CKD III. Denies past surgical history.   Objective:   Vitals: BP 126/82   Pulse 86   Temp 98.5 F (36.9 C) (Oral)   Resp 16   Ht 5\' 2"  (1.575 m)   Wt 163 lb 3.2 oz (74 kg)   SpO2 98%   BMI 29.85 kg/m   BP Readings from Last 3 Encounters:  03/27/16 126/82  02/17/16 132/88    Physical Exam  Constitutional: She is oriented to person, place, and time. She appears well-developed and well-nourished.  HENT:  TM's intact bilaterally, no effusions or erythema. Nasal turbinates pink and moist, nasal passages patent. No sinus tenderness. Oropharynx clear, mucous membranes moist, dentition in good repair.  Eyes: Right eye exhibits no discharge. Left eye exhibits no  discharge. No scleral icterus.  Neck: Normal range of motion. Neck supple.  Cardiovascular: Normal rate, regular rhythm and intact distal pulses.  Exam reveals no gallop and no friction rub.   No murmur heard. Pulmonary/Chest: No respiratory distress. She has wheezes (right sided, coarse lung sounds). She has no rales.  Lymphadenopathy:    She has no cervical adenopathy.  Neurological: She is alert and oriented to person, place, and time.  Skin: Skin is warm and dry.  Psychiatric: She has a normal mood and affect.   Assessment and Plan :   1. Lower respiratory infection 2. Cough 3. Sore throat 4. Atypical chest pain 5. Ear pain, bilateral - Will cover for bacterial infection with azithromycin. Use cough suppression medications and albuterol if wheezing persists. I did recommend patient start Zyrtec and maintain Sudafed (prn) for allergies and the fact that she smokes. She was agreeable to obtain an x-ray if her symptoms do not improve.  6. Tobacco use disorder - Counseled on smoking cessation. Discussed management of her diet and exercise to prevent weight gain. I will gladly consider using Belviq with this patient depending on her insurance coverage or use nicotine patches for smoking cessation if she is agreeable. Patient will let me know.  Jaynee Eagles, PA-C [facility] [phone] 03/27/2016  4:31 PM

## 2016-03-29 ENCOUNTER — Telehealth: Payer: Self-pay | Admitting: Urgent Care

## 2016-03-29 NOTE — Telephone Encounter (Signed)
Discussed Belviq use in setting of CKD. Last GFR was 51% in 2010. She was followed by nephrology up until 2 years ago, everything was stable. Part of our consideration for use of Belviq is to help smoking cessation and weight gain that she has experienced with it in the past. She eats healthily, stays very active but has trouble with her weight still. She plans on coming in for an OV to recheck her GFR, consider nicotine patches and get started with Belviq. Will check with our prior auth staff following her lab results.

## 2016-03-29 NOTE — Telephone Encounter (Signed)
Pt is calling mario to let him know that yes the belviq is covered under her insurance and needs prior autho with a number of [phone] to call  Best number for patient is [phone]

## 2016-03-30 ENCOUNTER — Telehealth: Payer: Self-pay | Admitting: Family Medicine

## 2016-03-30 NOTE — Telephone Encounter (Signed)
Pt wasn't feeling well enough to return back to work she had vomited on her way to work and her boss told her to go back home so she is asking for a note to return back to work tomorrow please fax note to job at [phone] please call pt when note has been faxed

## 2016-03-31 NOTE — Telephone Encounter (Signed)
Yes, this is completely okay. Thank you!

## 2016-03-31 NOTE — Telephone Encounter (Signed)
Ok to write to return 03/31/16? Seen 03/27/16

## 2016-03-31 NOTE — Telephone Encounter (Signed)
Printed and faxed

## 2016-04-06 ENCOUNTER — Encounter: Payer: Self-pay | Admitting: Urgent Care

## 2016-04-06 ENCOUNTER — Ambulatory Visit (INDEPENDENT_AMBULATORY_CARE_PROVIDER_SITE_OTHER): Payer: BC Managed Care – PPO | Admitting: Urgent Care

## 2016-04-06 VITALS — BP 124/92 | HR 65 | Temp 98.3°F | Resp 18 | Ht 62.0 in | Wt 160.0 lb

## 2016-04-06 DIAGNOSIS — N189 Chronic kidney disease, unspecified: Secondary | ICD-10-CM | POA: Diagnosis not present

## 2016-04-06 DIAGNOSIS — F17200 Nicotine dependence, unspecified, uncomplicated: Secondary | ICD-10-CM | POA: Diagnosis not present

## 2016-04-06 DIAGNOSIS — Z87898 Personal history of other specified conditions: Secondary | ICD-10-CM

## 2016-04-06 DIAGNOSIS — E663 Overweight: Secondary | ICD-10-CM | POA: Diagnosis not present

## 2016-04-06 MED ORDER — NICOTINE 14 MG/24HR TD PT24: 14.0000 mg | MEDICATED_PATCH | Freq: Every day | TRANSDERMAL | 0 refills | Status: DC

## 2016-04-06 NOTE — Patient Instructions (Addendum)
Lorcaserin oral tablets What is this medicine? LORCASERIN (lor ca SER in) is used to promote and maintain weight loss in obese patients. This medicine should be used with a reduced calorie diet and, if appropriate, an exercise program. This medicine may be used for other purposes; ask your health care provider or pharmacist if you have questions. COMMON BRAND NAME(S): Belviq What should I tell my health care provider before I take this medicine? They need to know if you have any of these conditions: -anatomical deformation of the penis, Peyronie's disease, or history of priapism (painful and prolonged erection) -diabetes -heart disease -history of blood diseases, like sickle cell anemia or leukemia -history of irregular heartbeat -kidney disease -liver disease -suicidal thoughts, plans, or attempt; a previous suicide attempt by you or a family member -an unusual or allergic reaction to lorcaserin, other medicines, foods, dyes, or preservatives -pregnant or trying to get pregnant -breast-feeding How should I use this medicine? Take this medicine by mouth with a glass of water. Follow the directions on the prescription label. You can take it with or without food. Take your medicine at regular intervals. Do not take it more often than directed. Do not stop taking except on your doctor's advice. Talk to your pediatrician regarding the use of this medicine in children. Special care may be needed. Overdosage: If you think you have taken too much of this medicine contact a poison control center or emergency room at once. NOTE: This medicine is only for you. Do not share this medicine with others. What if I miss a dose? If you miss a dose, take it as soon as you can. If it is almost time for your next dose, take only that dose. Do not take double or extra doses. What may interact with this medicine? -cabergoline -certain medicines for depression, anxiety, or psychotic disturbances -certain  medicines for erectile dysfunction -certain medicines for migraine headache like almotriptan, eletriptan, frovatriptan, naratriptan, rizatriptan, sumatriptan, zolmitriptan -dextromethorphan -linezolid -lithium -medicines for diabetes -other weight loss products -tramadol -St. John's Wort -stimulant medicines for attention disorders, weight loss, or to stay awake -tryptophan This list may not describe all possible interactions. Give your health care provider a list of all the medicines, herbs, non-prescription drugs, or dietary supplements you use. Also tell them if you smoke, drink alcohol, or use illegal drugs. Some items may interact with your medicine. What should I watch for while using this medicine? This medicine is intended to be used in addition to a healthy diet and appropriate exercise. The best results are achieved this way. Your doctor should instruct you to stop taking this medicine if you do not lose a certain amount of weight within the first 12 weeks of treatment, but it is important that you do not change your dose in any way without consulting your doctor or health care professional. Visit your doctor or health care professional for regular checkups. Your doctor may order blood tests or other tests to see how you are doing. Do not drive, use machinery, or do anything that needs mental alertness until you know how this medicine affects you. This medicine may affect blood sugar levels. If you have diabetes, check with your doctor or health care professional before you change your diet or the dose of your diabetic medicine. Patients and their families should watch out for worsening depression or thoughts of suicide. Also watch out for sudden changes in feelings such as feeling anxious, agitated, panicky, irritable, hostile, aggressive, impulsive, severely restless, overly  excited and hyperactive, or not being able to sleep. If this happens, especially at the beginning of treatment or  after a change in dose, call your health care professional. Contact your doctor or health care professional right away if you are a man with an erection that lasts longer than 4 hours or if the erection becomes painful. This may be a sign of serious problem and must be treated right away to prevent permanent damage. What side effects may I notice from receiving this medicine? Side effects that you should report to your doctor or health care professional as soon as possible: -allergic reactions like skin rash, itching or hives, swelling of the face, lips, or tongue -abnormal production of milk -breast enlargement in both males and females -breathing problems -changes in emotions or moods -changes in vision -confusion -erection lasting more than 4 hours or a painful erection -fast or irregular heart beat -feeling faint or lightheaded, falls -fever or chills, sore throat -hallucination, loss of contact with reality -high or low blood pressure -menstrual changes -restlessness -slow or irregular heartbeat -stiff muscles -sweating -suicidal thoughts or other mood changes -swelling of the ankles, feet, hands -unusually weak or tired -vomiting Side effects that usually do not require medical attention (report to your doctor or health care professional if they continue or are bothersome): -back pain -constipation -cough -dry mouth -nausea -tiredness This list may not describe all possible side effects. Call your doctor for medical advice about side effects. You may report side effects to FDA at 1-800-FDA-1088. Where should I keep my medicine? Keep out of the reach of children. This medicine can be abused. Keep your medicine in a safe place to protect it from theft. Do not share this medicine with anyone. Selling or giving away this medicine is dangerous and against the law. Store at room temperature between 15 and 30 degrees C (59 and 86 degrees F). Throw away any unused medicine after the  expiration date. NOTE: This sheet is a summary. It may not cover all possible information. If you have questions about this medicine, talk to your doctor, pharmacist, or health care provider.  2018 Elsevier/Gold Standard (2015-01-28 12:13:31)     Steps to Quit Smoking Smoking tobacco can be harmful to your health and can affect almost every organ in your body. Smoking puts you, and those around you, at risk for developing many serious chronic diseases. Quitting smoking is difficult, but it is one of the best things that you can do for your health. It is never too late to quit. What are the benefits of quitting smoking? When you quit smoking, you lower your risk of developing serious diseases and conditions, such as:  Lung cancer or lung disease, such as COPD.  Heart disease.  Stroke.  Heart attack.  Infertility.  Osteoporosis and bone fractures. Additionally, symptoms such as coughing, wheezing, and shortness of breath may get better when you quit. You may also find that you get sick less often because your body is stronger at fighting off colds and infections. If you are pregnant, quitting smoking can help to reduce your chances of having a baby of low birth weight. How do I get ready to quit? When you decide to quit smoking, create a plan to make sure that you are successful. Before you quit:  Pick a date to quit. Set a date within the next two weeks to give you time to prepare.  Write down the reasons why you are quitting. Keep this list in  places where you will see it often, such as on your bathroom mirror or in your car or wallet.  Identify the people, places, things, and activities that make you want to smoke (triggers) and avoid them. Make sure to take these actions:  Throw away all cigarettes at home, at work, and in your car.  Throw away smoking accessories, such as Scientist, research (medical).  Clean your car and make sure to empty the ashtray.  Clean your home, including  curtains and carpets.  Tell your family, friends, and coworkers that you are quitting. Support from your loved ones can make quitting easier.  Talk with your health care provider about your options for quitting smoking.  Find out what treatment options are covered by your health insurance. What strategies can I use to quit smoking? Talk with your healthcare provider about different strategies to quit smoking. Some strategies include:  Quitting smoking altogether instead of gradually lessening how much you smoke over a period of time. Research shows that quitting "cold [place]" is more successful than gradually quitting.  Attending in-person counseling to help you build problem-solving skills. You are more likely to have success in quitting if you attend several counseling sessions. Even short sessions of 10 minutes can be effective.  Finding resources and support systems that can help you to quit smoking and remain smoke-free after you quit. These resources are most helpful when you use them often. They can include:  Online chats with a Social worker.  Telephone quitlines.  Printed Furniture conservator/restorer.  Support groups or group counseling.  Text messaging programs.  Mobile phone applications.  Taking medicines to help you quit smoking. (If you are pregnant or breastfeeding, talk with your health care provider first.) Some medicines contain nicotine and some do not. Both types of medicines help with cravings, but the medicines that include nicotine help to relieve withdrawal symptoms. Your health care provider may recommend:  Nicotine patches, gum, or lozenges.  Nicotine inhalers or sprays.  Non-nicotine medicine that is taken by mouth. Talk with your health care provider about combining strategies, such as taking medicines while you are also receiving in-person counseling. Using these two strategies together makes you more likely to succeed in quitting than if you used either strategy on  its own. If you are pregnant or breastfeeding, talk with your health care provider about finding counseling or other support strategies to quit smoking. Do not take medicine to help you quit smoking unless told to do so by your health care provider. What things can I do to make it easier to quit? Quitting smoking might feel overwhelming at first, but there is a lot that you can do to make it easier. Take these important actions:  Reach out to your family and friends and ask that they support and encourage you during this time. Call telephone quitlines, reach out to support groups, or work with a counselor for support.  Ask people who smoke to avoid smoking around you.  Avoid places that trigger you to smoke, such as bars, parties, or smoke-break areas at work.  Spend time around people who do not smoke.  Lessen stress in your life, because stress can be a smoking trigger for some people. To lessen stress, try:  Exercising regularly.  Deep-breathing exercises.  Yoga.  Meditating.  Performing a body scan. This involves closing your eyes, scanning your body from head to toe, and noticing which parts of your body are particularly tense. Purposefully relax the muscles in those areas.  Download or purchase mobile phone or tablet apps (applications) that can help you stick to your quit plan by providing reminders, tips, and encouragement. There are many free apps, such as QuitGuide from the [employer] Office manager for Disease Control and Prevention). You can find other support for quitting smoking (smoking cessation) through smokefree.gov and other websites. How will I feel when I quit smoking? Within the first 24 hours of quitting smoking, you may start to feel some withdrawal symptoms. These symptoms are usually most noticeable 2-3 days after quitting, but they usually do not last beyond 2-3 weeks. Changes or symptoms that you might experience include:  Mood swings.  Restlessness, anxiety, or  irritation.  Difficulty concentrating.  Dizziness.  Strong cravings for sugary foods in addition to nicotine.  Mild weight gain.  Constipation.  Nausea.  Coughing or a sore throat.  Changes in how your medicines work in your body.  A depressed mood.  Difficulty sleeping (insomnia). After the first 2-3 weeks of quitting, you may start to notice more positive results, such as:  Improved sense of smell and taste.  Decreased coughing and sore throat.  Slower heart rate.  Lower blood pressure.  Clearer skin.  The ability to breathe more easily.  Fewer sick days. Quitting smoking is very challenging for most people. Do not get discouraged if you are not successful the first time. Some people need to make many attempts to quit before they achieve long-term success. Do your best to stick to your quit plan, and talk with your health care provider if you have any questions or concerns. This information is not intended to replace advice given to you by your health care provider. Make sure you discuss any questions you have with your health care provider. Document Released: 12/20/2000 Document Revised: 08/24/2015 Document Reviewed: 05/12/2014 Elsevier Interactive Patient Education  2017 [employer].     IF you received an x-ray today, you will receive an invoice from [facility]. Please contact [facility] at [phone] with questions or concerns regarding your invoice.   IF you received labwork today, you will receive an invoice from [facility]. Please contact [facility] at [phone] with questions or concerns regarding your invoice.   Our billing staff will not be able to assist you with questions regarding bills from these companies.  You will be contacted with the lab results as soon as they are available. The fastest way to get your results is to activate your My Chart account. Instructions are located on the last page of this paperwork. If you have not  heard from [place] regarding the results in 2 weeks, please contact this office.

## 2016-04-06 NOTE — Progress Notes (Addendum)
    MRN: [id number] DOB: [birth year]  Subjective:   [name] is a 36 y.o. female presenting for follow up on smoking cessation and obesity. At our last visit, patient was agreeable to discussing options for quitting smoking. She has tried before without medical therapy. Her primary concern is weight gain which happened in the past when she quit smoking. Patient has tried dieting, eats very healthily. She also exercises regularly, stays very active with her work, lifestyle. She notes that she has had chronic kidney disease since her pregnancy, was noted to have pre-eclampsia. Reports stage 3 kidney disease. Last lab level showed GFR of 51 in 2010. Imaging also showed renal atrophy, most prominent in her right kidney. Today, she reports wanting to recheck her kidney function, would like to start Belviq and will make sincere effort at quitting smoking. Of note, patient admits that her smoking is for coping, busy lifestyle and is part of her routine. She also has a difficult time because her husband still smokes. Patient is smoking 1/2 pack per day. Is trying to cut back. Denies fever, n/v, abdominal pain, hematuria, anuria, oliguria, lower leg swelling. Denies history of psychiatric disorders, SI.  [name] has a current medication list which includes the following prescription(s): cetirizine. Also has No Known Allergies. [name]  has a past medical history of Allergy; Chronic kidney disease (2009); and Pre-eclampsia. Also  has a past surgical history that includes Tooth extraction.  Objective:   Vitals: BP (!) 124/92   Pulse 65   Temp 98.3 F (36.8 C) (Oral)   Resp 18   Ht 5\' 2"  (1.575 m)   Wt 160 lb (72.6 kg)   SpO2 99%   BMI 29.26 kg/m   BP Readings from Last 3 Encounters:  04/06/16 (!) 124/92  03/27/16 126/82  02/17/16 132/88    Physical Exam  Constitutional: She is oriented to person, place, and time. She appears well-developed and well-nourished.  HENT:  Mouth/Throat: Oropharynx  is clear and moist.  Eyes: No scleral icterus.  Neck: Normal range of motion. Neck supple. No thyromegaly present.  Cardiovascular: Normal rate, regular rhythm and intact distal pulses.  Exam reveals no gallop and no friction rub.   No murmur heard. Pulmonary/Chest: No respiratory distress. She has no wheezes. She has no rales.  Abdominal: Soft. Bowel sounds are normal. She exhibits no distension and no mass. There is no tenderness. There is no guarding.  No CVA tenderness.  Musculoskeletal: She exhibits no edema.  Neurological: She is alert and oriented to person, place, and time.  Skin: Skin is warm and dry.  Psychiatric: She has a normal mood and affect.   Assessment and Plan :   1. Tobacco use disorder 2. Overweight (BMI 25.0-29.9) 3. History of weight gain 4. Chronic kidney disease, unspecified CKD stage - Patient will start Nicoderm patches, 14mg , for 6 weeks. She plans on quitting smoking 04/09/2016. Follow up for this in 6 weeks.  - Depending on labs, patient will start Belviq (prior authorization needed). We will follow closely, obtain bmet at 6 weeks if we end up starting this medication. Labs pending, will f/u with results. Continue healthy diet and exercise. - Offered counseling for her husband as well so that he can quit smoking too.   Jaynee Eagles, PA-C [facility] [phone] 04/06/2016 11:32 AM

## 2016-04-07 LAB — CMP14+EGFR
ALT: 48 IU/L — AB (ref 0–32)
AST: 32 IU/L (ref 0–40)
Albumin/Globulin Ratio: 1.6 (ref 1.2–2.2)
Albumin: 4.2 g/dL (ref 3.5–5.5)
Alkaline Phosphatase: 74 IU/L (ref 39–117)
BUN/Creatinine Ratio: 12 (ref 9–23)
BUN: 18 mg/dL (ref 6–20)
Bilirubin Total: 0.4 mg/dL (ref 0.0–1.2)
CALCIUM: 9.3 mg/dL (ref 8.7–10.2)
CO2: 20 mmol/L (ref 18–29)
CREATININE: 1.54 mg/dL — AB (ref 0.57–1.00)
Chloride: 101 mmol/L (ref 96–106)
GFR calc Af Amer: 50 mL/min/{1.73_m2} — ABNORMAL LOW (ref >59)
GFR, EST NON AFRICAN AMERICAN: 44 mL/min/{1.73_m2} — AB (ref >59)
Globulin, Total: 2.6 g/dL (ref 1.5–4.5)
Glucose: 90 mg/dL (ref 65–99)
Potassium: 5.1 mmol/L (ref 3.5–5.2)
Sodium: 138 mmol/L (ref 134–144)
Total Protein: 6.8 g/dL (ref 6.0–8.5)

## 2016-04-07 LAB — MICROALBUMIN / CREATININE URINE RATIO
Creatinine, Urine: 88.3 mg/dL
Microalb/Creat Ratio: 2199.1 mg/g creat — ABNORMAL HIGH (ref 0.0–30.0)
Microalbumin, Urine: 1941.8 ug/mL

## 2016-04-07 LAB — HEMOGLOBIN A1C
ESTIMATED AVERAGE GLUCOSE: 108 mg/dL
HEMOGLOBIN A1C: 5.4 % (ref 4.8–5.6)

## 2016-04-07 LAB — TSH: TSH: 1.9 u[IU]/mL (ref 0.450–4.500)

## 2016-04-08 ENCOUNTER — Other Ambulatory Visit: Payer: Self-pay | Admitting: Urgent Care

## 2016-04-08 MED ORDER — ORLISTAT 120 MG PO CAPS: 120.0000 mg | ORAL_CAPSULE | Freq: Three times a day (TID) | ORAL | 2 refills | Status: DC

## 2016-04-10 ENCOUNTER — Telehealth: Payer: Self-pay

## 2016-04-10 NOTE — Telephone Encounter (Signed)
xenical needs PA Key [id number]

## 2016-04-12 NOTE — Telephone Encounter (Signed)
Renay, please help with this prior auth. Thank you!

## 2016-04-14 NOTE — Telephone Encounter (Signed)
Your demographic data has been sent to the plan successfully. They will respond with your clinical questions and you will be notified by email when available within the next business day.

## 2016-04-26 ENCOUNTER — Telehealth: Payer: Self-pay

## 2016-04-26 NOTE — Telephone Encounter (Signed)
Received PA form today for patient's Xenical 120mg  capsule.  Tried filling out request on cover my meds, however, could not complete because we must have current insurance information.  Her last ins card is dated 2012.  LMVM for patient to [facility] with current card and information.

## 2016-05-03 NOTE — Telephone Encounter (Signed)
Tried calling patient again today, however, phone was not in service.

## 2016-05-18 ENCOUNTER — Ambulatory Visit: Payer: BC Managed Care – PPO | Admitting: Urgent Care

## 2016-05-31 ENCOUNTER — Telehealth: Payer: Self-pay

## 2016-10-31 NOTE — Telephone Encounter (Signed)
error 

## 2017-02-02 ENCOUNTER — Encounter: Payer: Self-pay | Admitting: Family Medicine

## 2017-02-02 ENCOUNTER — Ambulatory Visit: Payer: BC Managed Care – PPO | Admitting: Family Medicine

## 2017-02-02 VITALS — BP 150/109 | HR 69 | Temp 99.0°F | Resp 18 | Ht 62.5 in | Wt 172.4 lb

## 2017-02-02 DIAGNOSIS — J019 Acute sinusitis, unspecified: Secondary | ICD-10-CM

## 2017-02-02 DIAGNOSIS — R5383 Other fatigue: Secondary | ICD-10-CM

## 2017-02-02 DIAGNOSIS — R519 Headache, unspecified: Secondary | ICD-10-CM

## 2017-02-02 DIAGNOSIS — N183 Chronic kidney disease, stage 3 unspecified: Secondary | ICD-10-CM

## 2017-02-02 DIAGNOSIS — I10 Essential (primary) hypertension: Secondary | ICD-10-CM

## 2017-02-02 DIAGNOSIS — R51 Headache: Secondary | ICD-10-CM

## 2017-02-02 DIAGNOSIS — F4323 Adjustment disorder with mixed anxiety and depressed mood: Secondary | ICD-10-CM | POA: Diagnosis not present

## 2017-02-02 MED ORDER — AMLODIPINE BESYLATE 5 MG PO TABS: 5.0000 mg | ORAL_TABLET | Freq: Every day | ORAL | 1 refills | Status: DC

## 2017-02-02 MED ORDER — AMOXICILLIN-POT CLAVULANATE 875-125 MG PO TABS: 1.0000 | ORAL_TABLET | Freq: Two times a day (BID) | ORAL | 0 refills | Status: DC

## 2017-02-02 NOTE — Progress Notes (Signed)
Subjective:    Patient ID: [name], female    DOB: [birth year], 37 y.o.   MRN: [id number]  HPI [name] is a 37 y.o. female Presents today for: Chief Complaint  Patient presents with  . Headache    Off & On x 2weeks, Tylenol/Ibuprofen with no relief.   . Stress    Due to Several issues, Loss of Father, Loss of Dog. Life Changes, See Depression Screening  . Fatigue  . Weight Gain  . Sinus Problem    (Main Concern)   New patient to me, PCP is Celene Squibb, MD. No recent appt.  and only seen for acute issues.  Initially scheduled for same day appointment for sinus issues, but multiple other concerns as above.  Headache off and on for past 10 days.  Pressure behind eyes, nose and forehead. Blowing nose in morning and slight light green mucus past 3 days, but no other discolored nasal d/c. Min congestion. Some sneezing, min runny nose. No recent abx. No fever. BP elevated in office - hx of HTN, prior on meds - but "in denial".  No meds in past 4 years. No chest pains, no focal weakness.  Felt like diuretics made stool hard. Hx of CKD - stage 3 by her report, nonfunctioning left kidney. Has not seen nephrology in 4 years. Took ibuprofen yesterday. No otc cold meds.  Lab Results  Component Value Date   CREATININE 1.54 (H) 04/06/2016  eGFR 44  Some increased stress recently. Father committed suicide last 2022/11/02, dog died next week, work stressors,. Building a house.  Denies hx of depression or meds,  but feeling less motivated past month. No suicidal thoughts. No homicidal thoughts. Trouble sleeping.   Depression screen [facility] 2/9 02/02/2017 04/06/2016 03/27/2016 02/17/2016  Decreased Interest 2 0 0 0  Down, Depressed, Hopeless 2 0 0 0  PHQ - 2 Score 4 0 0 0  Altered sleeping 3 - - -  Tired, decreased energy 3 - - -  Change in appetite 2 - - -  Feeling bad or failure about yourself  3 - - -  Trouble concentrating 0 - - -  Moving slowly or fidgety/restless 1 - - -  Suicidal  thoughts 0 - - -  PHQ-9 Score 16 - - -  Difficult doing work/chores Very difficult - - -      Patient Active Problem List   Diagnosis Date Noted  . Tobacco use disorder 04/06/2016  . Chronic kidney disease 04/06/2016   Past Medical History:  Diagnosis Date  . Allergy   . Chronic kidney disease 2009  . Pre-eclampsia    Past Surgical History:  Procedure Laterality Date  . TOOTH EXTRACTION     No Known Allergies Prior to Admission medications   Medication Sig Start Date End Date Taking? Authorizing Provider  Acetaminophen (TYLENOL PO) Take by mouth.   Yes [provider]  cetirizine (ZYRTEC) 10 MG tablet Take 1 tablet (10 mg total) by mouth daily. Patient not taking: Reported on 02/02/2017 03/27/16   Jaynee Eagles, PA-C   Social History   Socioeconomic History  . Marital status: Married    Spouse name: Not on file  . Number of children: Not on file  . Years of education: Not on file  . Highest education level: Not on file  Social Needs  . Financial resource strain: Not on file  . Food insecurity - worry: Not on file  . Food insecurity - inability: Not  on file  . Transportation needs - medical: Not on file  . Transportation needs - non-medical: Not on file  Occupational History  . Not on file  Tobacco Use  . Smoking status: Current Every Day Smoker    Packs/day: 0.25    Types: Cigarettes  . Smokeless tobacco: Never Used  Substance and Sexual Activity  . Alcohol use: Not on file  . Drug use: Not on file  . Sexual activity: Not on file  Other Topics Concern  . Not on file  Social History Narrative  . Not on file    Review of Systems  HENT: Positive for rhinorrhea, sinus pressure and sinus pain.   Respiratory: Negative for chest tightness and shortness of breath.   Neurological: Positive for headaches. Negative for weakness.  Psychiatric/Behavioral: Positive for sleep disturbance. Negative for self-injury and suicidal ideas.       Objective:    Physical Exam  Constitutional: She is oriented to person, place, and time. She appears well-developed and well-nourished. No distress.  HENT:  Head: Normocephalic and atraumatic.  Right Ear: Hearing, tympanic membrane, external ear and ear canal normal.  Left Ear: Hearing, tympanic membrane, external ear and ear canal normal.  Nose: Nose normal.  Mouth/Throat: Oropharynx is clear and moist. No oropharyngeal exudate.  Eyes: Conjunctivae and EOM are normal. Pupils are equal, round, and reactive to light.  Neck: Carotid bruit is not present.  Cardiovascular: Normal rate, regular rhythm, normal heart sounds and intact distal pulses.  No murmur heard. Pulmonary/Chest: Effort normal and breath sounds normal. No respiratory distress. She has no wheezes. She has no rhonchi.  Abdominal: Soft. She exhibits no pulsatile midline mass. There is no tenderness.  Neurological: She is alert and oriented to person, place, and time.  Skin: Skin is warm and dry. No rash noted.  Psychiatric: She has a normal mood and affect. Her behavior is normal.  Vitals reviewed. Nonfocal neurologic exam, no focal weakness, minimal sinus tenderness maxillary sinuses. Vitals:   02/02/17 0931 02/02/17 0934  BP: (!) 141/87 (!) 150/109  Pulse: 69   Resp: 18   Temp: 99 F (37.2 C)   TempSrc: Oral   SpO2: 100%   Weight: 172 lb 6.4 oz (78.2 kg)   Height: 5' 2.5" (1.588 m)       Assessment & Plan:    SHAKEMIA MADERA is a 37 y.o. female Adjustment disorder with mixed anxiety and depressed mood - Plan: TSH Fatigue, unspecified type - Plan: TSH  - Depression versus adjustment disorder with multiple situational stressors and death in family few months ago. Some component of anhedonia.  -Initially recommended counseling, phone numbers provided, check TSH, follow-up to discuss further in the next 1 week to decide on other medication. ER/RTC precautions if worsening  Nonintractable headache, unspecified chronicity pattern,  unspecified headache type Acute non-recurrent sinusitis, unspecified location - Plan: amoxicillin-clavulanate (AUGMENTIN) 875-125 MG tablet  -Headache may be related to untreated hypertension, also may have component of stress headache with symptoms as above. Nonfocal neuro exam. Does have some discolored nasal discharge and some sinus tenderness. Possible early sinusitis.  -Treat hypertension, then if headaches not improving, printed Augmentin to start - potential side effects discussed.  Essential hypertension - Plan: amLODipine (NORVASC) 5 MG tablet, Basic metabolic panel, TSH  -Nonadherent to medication. May have some component of depression and lack of drive as above. Return to restart medication, will initially restart Norvasc until renal function known with history of chronic kidney disease and lack  of follow-up with nephrology. Recheck 1 week  CKD (chronic kidney disease) stage 3, GFR 30-59 ml/min (HCC) - Plan: Basic metabolic panel  -Stressed importance of ongoing care with renal function monitoring and nephrology follow-up. Restart antihypertensive, as untreated hypertension and affect on kidney disease was discussed. Follow-up in one week. Could add ACE inhibitor depending on renal function  Meds ordered this encounter  Medications  . amLODipine (NORVASC) 5 MG tablet    Sig: Take 1 tablet (5 mg total) by mouth daily.    Dispense:  30 tablet    Refill:  1  . amoxicillin-clavulanate (AUGMENTIN) 875-125 MG tablet    Sig: Take 1 tablet by mouth 2 (two) times daily.    Dispense:  20 tablet    Refill:  0   Patient Instructions   You may have some depression, and would recommend meeting with counselor. Please follow up in next week to discuss this further and review blood work from today.  Vivia Budge: [phone] Arvil Chaco: [phone]  I will check kidney function and other blood tests - follow up in 1 week to discuss these results and plan for kidney issues.   Start amlodipine  for blood pressure, saline nasal spray 3-4 times per day, then if sinus pressure, headache, and discolored discharge is not improving in the next few days, start antibiotic for possible sinus infection.Return to the clinic or go to the nearest emergency room if any of your symptoms worsen or new symptoms occur.   General Headache Without Cause A headache is pain or discomfort felt around the head or neck area. There are many causes and types of headaches. In some cases, the cause may not be found. Follow these instructions at home: Managing pain  Take over-the-counter and prescription medicines only as told by your doctor.  Lie down in a dark, quiet room when you have a headache.  If directed, apply ice to the head and neck area: ? Put ice in a plastic bag. ? Place a towel between your skin and the bag. ? Leave the ice on for 20 minutes, 2-3 times per day.  Use a heating pad or hot shower to apply heat to the head and neck area as told by your doctor.  Keep lights dim if bright lights bother you or make your headaches worse. Eating and drinking  Eat meals on a regular schedule.  Lessen how much alcohol you drink.  Lessen how much caffeine you drink, or stop drinking caffeine. General instructions  Keep all follow-up visits as told by your doctor. This is important.  Keep a journal to find out if certain things bring on headaches. For example, write down: ? What you eat and drink. ? How much sleep you get. ? Any change to your diet or medicines.  Relax by getting a massage or doing other relaxing activities.  Lessen stress.  Sit up straight. Do not tighten (tense) your muscles.  Do not use tobacco products. This includes cigarettes, chewing tobacco, or e-cigarettes. If you need help quitting, ask your doctor.  Exercise regularly as told by your doctor.  Get enough sleep. This often means 7-9 hours of sleep. Contact a doctor if:  Your symptoms are not helped by  medicine.  You have a headache that feels different than the other headaches.  You feel sick to your stomach (nauseous) or you throw up (vomit).  You have a fever. Get help right away if:  Your headache becomes really bad.  You keep  throwing up.  You have a stiff neck.  You have trouble seeing.  You have trouble speaking.  You have pain in the eye or ear.  Your muscles are weak or you lose muscle control.  You lose your balance or have trouble walking.  You feel like you will pass out (faint) or you pass out.  You have confusion. This information is not intended to replace advice given to you by your health care provider. Make sure you discuss any questions you have with your health care provider. Document Released: 10/05/2007 Document Revised: 06/03/2015 Document Reviewed: 04/20/2014 Elsevier Interactive Patient Education  2018 [employer].    Adjustment Disorder, Adult Adjustment disorder is a group of symptoms that can develop after a stressful life event, such as the loss of a job or serious physical illness. The symptoms can affect how you feel, think, and act. They may interfere with your relationships. Adjustment disorder increases your risk of suicide and substance abuse. If this disorder is not managed early, it can develop into a more serious condition, such as major depressive disorder or post-traumatic stress disorder. What are the causes? This condition happens when you have trouble recovering from or coping with a stressful life event. What increases the risk? You are more likely to develop this condition if:  You have had depression or anxiety.  You are being treated for a long-term (chronic) illness.  You are being treated for an illness that cannot be cured (terminal illness).  You have a family history of mental illness.  What are the signs or symptoms? Symptoms of this condition include:  Extreme trouble doing daily tasks, such as going to  work.  Sadness, depression, or crying spells.  Worrying a lot.  Loss of enjoyment.  Change in appetite or weight.  Feelings of loss or hopelessness.  Thoughts of suicide.  Anxiety, worry, or nervousness.  Trouble sleeping.  Avoiding family and friends.  Fighting or vandalism.  Complaining of feeling sick without being ill.  Feeling dazed or disconnected.  Nightmares.  Trouble sleeping.  Irritability.  Reckless driving.  Poor work Systems analyst.  Ignoring bills.  Symptoms of this condition start within three months of the stressful event. They do not last more than six months, unless the stressful circumstances last longer. Normal grieving after the death of a loved one is not a symptom of this condition. How is this diagnosed? To diagnose this condition, your health care provider will ask about what has happened in your life and how it has affected you. He or she may also ask about your medical history and your use of medicines, alcohol, and other substances. Your health care provider may do a physical exam and order lab tests or other studies. You may be referred to a mental health specialist. How is this treated? Treatment options for this condition include:  Counseling or talk therapy. Talk therapy is usually provided by mental health specialists.  Medicines. Certain medicines may help with depression, anxiety, and sleep.  Support groups. These offer emotional support, advice, and guidance. They are made up of people who have had similar experiences.  Observation and time. This is sometimes called "watchful waiting." In this treatment, health care providers monitor your health and behavior without other treatment. Adjustment disorder sometimes gets better on its own with time.  Follow these instructions at home:  Take over-the-counter and prescription medicines only as told by your health care provider.  Keep all follow-up visits as told by your health care  provider. This is important. Contact a health care provider if:  Your symptoms do not improve in six months.  Your symptoms get worse. Get help right away if:  You have serious thoughts about hurting yourself or someone else. If you ever feel like you may hurt yourself or others, or have thoughts about taking your own life, get help right away. You can go to your nearest emergency department or call:  Your local emergency services (911 in the [place]  A suicide crisis helpline, such as the [place] at (253)817-9506. This is open 24 hours a day.  Summary  Adjustment disorder is a group of symptoms that can develop after a stressful life event, such as the loss of a job or serious physical illness. The symptoms can affect how you feel, think, and act. They may interfere with your relationships.  Symptoms of this condition start within three months of the stressful event. They do not last more than six months, unless the stressful circumstances last longer.  Treatment may include talk therapy, medicines, participation in a support group, or observation to see if symptoms improve.  Contact your health care provider if your symptoms get worse or do not improve in six months.  If you ever feel like you may hurt yourself or others, or have thoughts about taking your own life, get help right away. This information is not intended to replace advice given to you by your health care provider. Make sure you discuss any questions you have with your health care provider. Document Released: 08/30/2005 Document Revised: 02/25/2016 Document Reviewed: 02/25/2016 Elsevier Interactive Patient Education  2018 [employer].      IF you received an x-ray today, you will receive an invoice from [facility]. Please contact [facility] at [phone] with questions or concerns regarding your invoice.   IF you received labwork today, you will receive an  invoice from [facility]. Please contact [facility] at [phone] with questions or concerns regarding your invoice.   Our billing staff will not be able to assist you with questions regarding bills from these companies.  You will be contacted with the lab results as soon as they are available. The fastest way to get your results is to activate your My Chart account. Instructions are located on the last page of this paperwork. If you have not heard from [place] regarding the results in 2 weeks, please contact this office.     Signed,   Merri Ray, MD [facility].  02/03/17 12:38 PM

## 2017-02-02 NOTE — Patient Instructions (Addendum)
You may have some depression, and would recommend meeting with counselor. Please follow up in next week to discuss this further and review blood work from today.  Vivia Budge: [phone] Arvil [place]aco: [phone]  I will check kidney function and other blood tests - follow up in 1 week to discuss these results and plan for kidney issues.   Start amlodipine for blood pressure, saline nasal spray 3-4 times per day, then if sinus pressure, headache, and discolored discharge is not improving in the next few days, start antibiotic for possible sinus infection.Return to the clinic or go to the nearest emergency room if any of your symptoms worsen or new symptoms occur.   General Headache Without Cause A headache is pain or discomfort felt around the head or neck area. There are many causes and types of headaches. In some cases, the cause may not be found. Follow these instructions at home: Managing pain  Take over-the-counter and prescription medicines only as told by your doctor.  Lie down in a dark, quiet room when you have a headache.  If directed, apply ice to the head and neck area: ? Put ice in a plastic bag. ? Place a towel between your skin and the bag. ? Leave the ice on for 20 minutes, 2-3 times per day.  Use a heating pad or hot shower to apply heat to the head and neck area as told by your doctor.  Keep lights dim if bright lights bother you or make your headaches worse. Eating and drinking  Eat meals on a regular schedule.  Lessen how much alcohol you drink.  Lessen how much caffeine you drink, or stop drinking caffeine. General instructions  Keep all follow-up visits as told by your doctor. This is important.  Keep a journal to find out if certain things bring on headaches. For example, write down: ? What you eat and drink. ? How much sleep you get. ? Any change to your diet or medicines.  Relax by getting a massage or doing other relaxing activities.  Lessen  stress.  Sit up straight. Do not tighten (tense) your muscles.  Do not use tobacco products. This includes cigarettes, chewing tobacco, or e-cigarettes. If you need help quitting, ask your doctor.  Exercise regularly as told by your doctor.  Get enough sleep. This often means 7-9 hours of sleep. Contact a doctor if:  Your symptoms are not helped by medicine.  You have a headache that feels different than the other headaches.  You feel sick to your stomach (nauseous) or you throw up (vomit).  You have a fever. Get help right away if:  Your headache becomes really bad.  You keep throwing up.  You have a stiff neck.  You have trouble seeing.  You have trouble speaking.  You have pain in the eye or ear.  Your muscles are weak or you lose muscle control.  You lose your balance or have trouble walking.  You feel like you will pass out (faint) or you pass out.  You have confusion. This information is not intended to replace advice given to you by your health care provider. Make sure you discuss any questions you have with your health care provider. Document Released: 10/05/2007 Document Revised: 06/03/2015 Document Reviewed: 04/20/2014 Elsevier Interactive Patient Education  2018 [employer].    Adjustment Disorder, Adult Adjustment disorder is a group of symptoms that can develop after a stressful life event, such as the loss of a job or serious physical  illness. The symptoms can affect how you feel, think, and act. They may interfere with your relationships. Adjustment disorder increases your risk of suicide and substance abuse. If this disorder is not managed early, it can develop into a more serious condition, such as major depressive disorder or post-traumatic stress disorder. What are the causes? This condition happens when you have trouble recovering from or coping with a stressful life event. What increases the risk? You are more likely to develop this condition  if:  You have had depression or anxiety.  You are being treated for a long-term (chronic) illness.  You are being treated for an illness that cannot be cured (terminal illness).  You have a family history of mental illness.  What are the signs or symptoms? Symptoms of this condition include:  Extreme trouble doing daily tasks, such as going to work.  Sadness, depression, or crying spells.  Worrying a lot.  Loss of enjoyment.  Change in appetite or weight.  Feelings of loss or hopelessness.  Thoughts of suicide.  Anxiety, worry, or nervousness.  Trouble sleeping.  Avoiding family and friends.  Fighting or vandalism.  Complaining of feeling sick without being ill.  Feeling dazed or disconnected.  Nightmares.  Trouble sleeping.  Irritability.  Reckless driving.  Poor work Systems analyst.  Ignoring bills.  Symptoms of this condition start within three months of the stressful event. They do not last more than six months, unless the stressful circumstances last longer. Normal grieving after the death of a loved one is not a symptom of this condition. How is this diagnosed? To diagnose this condition, your health care provider will ask about what has happened in your life and how it has affected you. He or she may also ask about your medical history and your use of medicines, alcohol, and other substances. Your health care provider may do a physical exam and order lab tests or other studies. You may be referred to a mental health specialist. How is this treated? Treatment options for this condition include:  Counseling or talk therapy. Talk therapy is usually provided by mental health specialists.  Medicines. Certain medicines may help with depression, anxiety, and sleep.  Support groups. These offer emotional support, advice, and guidance. They are made up of people who have had similar experiences.  Observation and time. This is sometimes called "watchful  waiting." In this treatment, health care providers monitor your health and behavior without other treatment. Adjustment disorder sometimes gets better on its own with time.  Follow these instructions at home:  Take over-the-counter and prescription medicines only as told by your health care provider.  Keep all follow-up visits as told by your health care provider. This is important. Contact a health care provider if:  Your symptoms do not improve in six months.  Your symptoms get worse. Get help right away if:  You have serious thoughts about hurting yourself or someone else. If you ever feel like you may hurt yourself or others, or have thoughts about taking your own life, get help right away. You can go to your nearest emergency department or call:  Your local emergency services (911 in the [place]  A suicide crisis helpline, such as the [facility] at (720)826-9345. This is open 24 hours a day.  Summary  Adjustment disorder is a group of symptoms that can develop after a stressful life event, such as the loss of a job or serious physical illness. The symptoms can affect how you feel,  think, and act. They may interfere with your relationships.  Symptoms of this condition start within three months of the stressful event. They do not last more than six months, unless the stressful circumstances last longer.  Treatment may include talk therapy, medicines, participation in a support group, or observation to see if symptoms improve.  Contact your health care provider if your symptoms get worse or do not improve in six months.  If you ever feel like you may hurt yourself or others, or have thoughts about taking your own life, get help right away. This information is not intended to replace advice given to you by your health care provider. Make sure you discuss any questions you have with your health care provider. Document Released: 08/30/2005 Document Revised:  02/25/2016 Document Reviewed: 02/25/2016 Elsevier Interactive Patient Education  2018 [employer].      IF you received an x-ray today, you will receive an invoice from [facility]. Please contact [facility] at [phone] with questions or concerns regarding your invoice.   IF you received labwork today, you will receive an invoice from [facility]. Please contact [facility] at [phone] with questions or concerns regarding your invoice.   Our billing staff will not be able to assist you with questions regarding bills from these companies.  You will be contacted with the lab results as soon as they are available. The fastest way to get your results is to activate your My Chart account. Instructions are located on the last page of this paperwork. If you have not heard from [place] regarding the results in 2 weeks, please contact this office.

## 2017-02-03 LAB — BASIC METABOLIC PANEL
BUN/Creatinine Ratio: 15 (ref 9–23)
BUN: 27 mg/dL — AB (ref 6–20)
CALCIUM: 9.9 mg/dL (ref 8.7–10.2)
CHLORIDE: 102 mmol/L (ref 96–106)
CO2: 21 mmol/L (ref 20–29)
CREATININE: 1.78 mg/dL — AB (ref 0.57–1.00)
GFR calc Af Amer: 42 mL/min/{1.73_m2} — ABNORMAL LOW (ref >59)
GFR calc non Af Amer: 36 mL/min/{1.73_m2} — ABNORMAL LOW (ref >59)
GLUCOSE: 80 mg/dL (ref 65–99)
Potassium: 5.1 mmol/L (ref 3.5–5.2)
Sodium: 139 mmol/L (ref 134–144)

## 2017-02-03 LAB — TSH: TSH: 1.42 u[IU]/mL (ref 0.450–4.500)

## 2017-02-07 ENCOUNTER — Ambulatory Visit: Payer: BC Managed Care – PPO | Admitting: Emergency Medicine

## 2017-02-07 ENCOUNTER — Other Ambulatory Visit: Payer: Self-pay

## 2017-02-07 ENCOUNTER — Ambulatory Visit: Payer: Self-pay

## 2017-02-07 ENCOUNTER — Encounter: Payer: Self-pay | Admitting: Emergency Medicine

## 2017-02-07 VITALS — BP 124/84 | HR 73 | Temp 97.8°F | Resp 16 | Ht 61.5 in | Wt 172.6 lb

## 2017-02-07 DIAGNOSIS — R21 Rash and other nonspecific skin eruption: Secondary | ICD-10-CM

## 2017-02-07 MED ORDER — TRIAMCINOLONE ACETONIDE 0.1 % EX CREA: 1.0000 "application " | TOPICAL_CREAM | Freq: Two times a day (BID) | CUTANEOUS | 0 refills | Status: DC

## 2017-02-07 NOTE — Telephone Encounter (Signed)
Pt. called to report onset of rash on neck about 2 hours ago.  Reported the rash is small bumps, like tip of pencil lead, with pink blotchy appearance.  C/o itching. Denied swelling of face, lips, tongue, or throat.  Stated her face feels flushed, and she feels like she could break out in a sweat, but skin is dry.  Denied rash on other areas of body.  Reported she started the Amlodipine on 1/23, and has taken it daily since then; last dose taken this AM.  Per protocol, appt. Scheduled today; pt. At work, and requested a late appt.  Given an appt. For 5:20 PM At [facility] with Dr. Mitchel Honour.  Agreed with plan.  Advised to seek emergency care if symptoms worsen; ie: facial, lip, tongue, or throat swelling, or shortness of breath.  Verb. Understanding.         Reason for Disposition . Hives or itching  Answer Assessment - Initial Assessment Questions 1. APPEARANCE of RASH: "Describe the rash." (e.g., spots, blisters, raised areas, skin peeling, scaly)     Small red bumps and blotchy 2. SIZE: "How big are the spots?" (e.g., tip of pen, eraser, coin; inches, centimeters)     Lead pencil tip 3. LOCATION: "Where is the rash located?"     Left side of neck 4. COLOR: "What color is the rash?" (Note: It is difficult to assess rash color in people with darker-colored skin. When this situation occurs, simply ask the caller to describe what they see.)    Pink  5. ONSET: "When did the rash begin?"      Approx. 2 hrs. ago 6. FEVER: "Do you have a fever?" If so, ask: "What is your temperature, how was it measured, and when did it start?"     Rash is slightly warm 7. ITCHING: "Does the rash itch?" If so, ask: "How bad is the itch?" (Scale 1-10; or mild, moderate, severe)     yes 8. CAUSE: "What do you think is causing the rash?"    Started new BP medication on 1/23 9. NEW MEDICATION: "What new medication are you taking?" (e.g., name of antibiotic) "When did you start taking this medication?".     Amlodipine   10. OTHER SYMPTOMS: "Do you have any other symptoms?" (e.g., sore throat, fever, joint pain)       Feels flushed , pink bumps on neck  11. PREGNANCY: "Is there any chance you are pregnant?" "When was your last menstrual period?"       No; IUD  Protocols used: RASH - WIDESPREAD ON DRUGS-A-AH

## 2017-02-07 NOTE — Progress Notes (Signed)
[name] 37 y.o.   Chief Complaint  Patient presents with  . Rash    ON NECK with itching started today    HISTORY OF PRESENT ILLNESS: This is a 37 y.o. female complaining of rash to the left side of the neck that started today.  No other significant symptomatology.  HPI   Prior to Admission medications   Medication Sig Start Date End Date Taking? Authorizing Provider  amLODipine (NORVASC) 5 MG tablet Take 1 tablet (5 mg total) by mouth daily. 02/02/17  Yes Wendie Agreste, MD  Acetaminophen (TYLENOL PO) Take by mouth.    [provider]  amoxicillin-clavulanate (AUGMENTIN) 875-125 MG tablet Take 1 tablet by mouth 2 (two) times daily. Patient not taking: Reported on 02/07/2017 02/02/17   Wendie Agreste, MD  cetirizine (ZYRTEC) 10 MG tablet Take 1 tablet (10 mg total) by mouth daily. Patient not taking: Reported on 02/02/2017 03/27/16   Jaynee Eagles, PA-C  triamcinolone cream (KENALOG) 0.1 % Apply 1 application topically 2 (two) times daily. 02/07/17   Horald Pollen, MD    No Known Allergies  Patient Active Problem List   Diagnosis Date Noted  . Tobacco use disorder 04/06/2016  . Chronic kidney disease 04/06/2016    Past Medical History:  Diagnosis Date  . Allergy   . Chronic kidney disease 2009  . Pre-eclampsia     Past Surgical History:  Procedure Laterality Date  . TOOTH EXTRACTION      Social History   Socioeconomic History  . Marital status: Married    Spouse name: Not on file  . Number of children: Not on file  . Years of education: Not on file  . Highest education level: Not on file  Social Needs  . Financial resource strain: Not on file  . Food insecurity - worry: Not on file  . Food insecurity - inability: Not on file  . Transportation needs - medical: Not on file  . Transportation needs - non-medical: Not on file  Occupational History  . Not on file  Tobacco Use  . Smoking status: Current Every Day Smoker    Packs/day:  0.25    Types: Cigarettes  . Smokeless tobacco: Never Used  Substance and Sexual Activity  . Alcohol use: Not on file  . Drug use: Not on file  . Sexual activity: Not on file  Other Topics Concern  . Not on file  Social History Narrative  . Not on file    No family history on file.   Review of Systems  Constitutional: Negative for chills and fever.  HENT: Negative for congestion and nosebleeds.   Eyes: Negative for discharge and redness.  Respiratory: Negative for cough, shortness of breath and wheezing.   Cardiovascular: Negative for chest pain and palpitations.  Gastrointestinal: Negative for abdominal pain, diarrhea, nausea and vomiting.  Genitourinary: Negative for dysuria and hematuria.  Musculoskeletal: Negative for back pain, myalgias and neck pain.  Skin: Positive for itching and rash.  Neurological: Negative for dizziness and headaches.  Endo/Heme/Allergies: Negative.     Vitals:   02/07/17 1718  BP: 124/84  Pulse: 73  Resp: 16  Temp: 97.8 F (36.6 C)  SpO2: 98%    Physical Exam  Constitutional: She is oriented to person, place, and time. She appears well-developed and well-nourished.  HENT:  Head: Normocephalic and atraumatic.  Nose: Nose normal.  Mouth/Throat: Oropharynx is clear and moist.  Eyes: Conjunctivae and EOM are normal. Pupils are equal, round,  and reactive to light.  Neck: Normal range of motion. Neck supple.  Cardiovascular: Normal rate and regular rhythm.  Pulmonary/Chest: Effort normal and breath sounds normal.  Abdominal: Soft. There is no tenderness.  Musculoskeletal: Normal range of motion.  Lymphadenopathy:    She has no cervical adenopathy.  Neurological: She is alert and oriented to person, place, and time.  Skin: Capillary refill takes less than 2 seconds.  Positive erythema with mild inflammation of the left lower neck area.  Psychiatric: She has a normal mood and affect. Her behavior is normal.  Vitals  reviewed.    ASSESSMENT & PLAN: [name] was seen today for rash.  Diagnoses and all orders for this visit:  Rash and nonspecific skin eruption -     triamcinolone cream (KENALOG) 0.1 %; Apply 1 application topically 2 (two) times daily.    Patient Instructions       IF you received an x-ray today, you will receive an invoice from [facility] Rad[facility]. Please contact [facility]iology at [phone] with questions or concerns regarding your invoice.   IF you received labwork today, you will receive an invoice from [facility]. Please contact [facility] at [phone] with questions or concerns regarding your invoice.   Our billing staff will not be able to assist you with questions regarding bills from these companies.  You will be contacted with the lab results as soon as they are available. The fastest way to get your results is to activate your My Chart account. Instructions are located on the last page of this paperwork. If you have not heard from [place] regarding the results in 2 weeks, please contact this office.     Contact Dermatitis Dermatitis is redness, soreness, and swelling (inflammation) of the skin. Contact dermatitis is a reaction to certain substances that touch the skin. You either touched something that irritated your skin, or you have allergies to something you touched. Follow these instructions at home: [place] your skin as needed.  Apply cool compresses to the affected areas.  Try taking a bath with: ? Epsom salts. Follow the instructions on the package. You can get these at a pharmacy or grocery store. ? Baking soda. Pour a small amount into the bath as told by your doctor. ? Colloidal oatmeal. Follow the instructions on the package. You can get this at a pharmacy or grocery store.  Try applying baking soda paste to your skin. Stir water into baking soda until it looks like paste.  Do not scratch your skin.  Bathe less often.  Bathe in  lukewarm water. Avoid using hot water. Medicines  Take or apply over-the-counter and prescription medicines only as told by your doctor.  If you were prescribed an antibiotic medicine, take or apply your antibiotic as told by your doctor. Do not stop taking the antibiotic even if your condition starts to get better. General instructions  Keep all follow-up visits as told by your doctor. This is important.  Avoid the substance that caused your reaction. If you do not know what caused it, keep a journal to try to track what caused it. Write down: ? What you eat. ? What cosmetic products you use. ? What you drink. ? What you wear in the affected area. This includes jewelry.  If you were given a bandage (dressing), take care of it as told by your doctor. This includes when to change and remove it. Contact a doctor if:  You do not get better with treatment.  Your  condition gets worse.  You have signs of infection such as: ? Swelling. ? Tenderness. ? Redness. ? Soreness. ? Warmth.  You have a fever.  You have new symptoms. Get help right away if:  You have a very bad headache.  You have neck pain.  Your neck is stiff.  You throw up (vomit).  You feel very sleepy.  You see red streaks coming from the affected area.  Your bone or joint underneath the affected area becomes painful after the skin has healed.  The affected area turns darker.  You have trouble breathing. This information is not intended to replace advice given to you by your health care provider. Make sure you discuss any questions you have with your health care provider. Document Released: 10/23/2008 Document Revised: 06/03/2015 Document Reviewed: 05/13/2014 Elsevier Interactive Patient Education  2018 [employer]      Agustina Caroli, MD [facility]

## 2017-02-07 NOTE — Patient Instructions (Addendum)
     IF you received an x-ray today, you will receive an invoice from [facility] Radiology. Please contact [facility] Radiology at [phone] with questions or concerns regarding your invoice.   IF you received labwork today, you will receive an invoice from [facility]. Please contact [facility] at [phone] with questions or concerns regarding your invoice.   Our billing staff will not be able to assist you with questions regarding bills from these companies.  You will be contacted with the lab results as soon as they are available. The fastest way to get your results is to activate your My Chart account. Instructions are located on the last page of this paperwork. If you have not heard from [place] regarding the results in 2 weeks, please contact this office.     Contact Dermatitis Dermatitis is redness, soreness, and swelling (inflammation) of the skin. Contact dermatitis is a reaction to certain substances that touch the skin. You either touched something that irritated your skin, or you have allergies to something you touched. Follow these instructions at home: [place] your skin as needed.  Apply cool compresses to the affected areas.  Try taking a bath with: ? Epsom salts. Follow the instructions on the package. You can get these at a pharmacy or grocery store. ? Baking soda. Pour a small amount into the bath as told by your doctor. ? Colloidal oatmeal. Follow the instructions on the package. You can get this at a pharmacy or grocery store.  Try applying baking soda paste to your skin. Stir water into baking soda until it looks like paste.  Do not scratch your skin.  Bathe less often.  Bathe in lukewarm water. Avoid using hot water. Medicines  Take or apply over-the-counter and prescription medicines only as told by your doctor.  If you were prescribed an antibiotic medicine, take or apply your antibiotic as told by your doctor. Do not stop taking the antibiotic  even if your condition starts to get better. General instructions  Keep all follow-up visits as told by your doctor. This is important.  Avoid the substance that caused your reaction. If you do not know what caused it, keep a journal to try to track what caused it. Write down: ? What you eat. ? What cosmetic products you use. ? What you drink. ? What you wear in the affected area. This includes jewelry.  If you were given a bandage (dressing), take care of it as told by your doctor. This includes when to change and remove it. Contact a doctor if:  You do not get better with treatment.  Your condition gets worse.  You have signs of infection such as: ? Swelling. ? Tenderness. ? Redness. ? Soreness. ? Warmth.  You have a fever.  You have new symptoms. Get help right away if:  You have a very bad headache.  You have neck pain.  Your neck is stiff.  You throw up (vomit).  You feel very sleepy.  You see red streaks coming from the affected area.  Your bone or joint underneath the affected area becomes painful after the skin has healed.  The affected area turns darker.  You have trouble breathing. This information is not intended to replace advice given to you by your health care provider. Make sure you discuss any questions you have with your health care provider. Document Released: 10/23/2008 Document Revised: 06/03/2015 Document Reviewed: 05/13/2014 Elsevier Interactive Patient Education  2018 [employer].

## 2017-02-12 ENCOUNTER — Encounter: Payer: Self-pay | Admitting: *Deleted

## 2017-02-13 ENCOUNTER — Other Ambulatory Visit: Payer: Self-pay

## 2017-02-13 ENCOUNTER — Encounter: Payer: Self-pay | Admitting: Family Medicine

## 2017-02-13 ENCOUNTER — Ambulatory Visit: Payer: BC Managed Care – PPO | Admitting: Family Medicine

## 2017-02-13 VITALS — BP 122/78 | HR 78 | Temp 98.5°F | Resp 16 | Ht 61.61 in | Wt 171.0 lb

## 2017-02-13 DIAGNOSIS — F4320 Adjustment disorder, unspecified: Secondary | ICD-10-CM

## 2017-02-13 DIAGNOSIS — N183 Chronic kidney disease, stage 3 unspecified: Secondary | ICD-10-CM

## 2017-02-13 DIAGNOSIS — I10 Essential (primary) hypertension: Secondary | ICD-10-CM | POA: Diagnosis not present

## 2017-02-13 MED ORDER — AMLODIPINE BESYLATE 5 MG PO TABS: 5.0000 mg | ORAL_TABLET | Freq: Every day | ORAL | 1 refills | Status: DC

## 2017-02-13 NOTE — Patient Instructions (Addendum)
Call your nephrologist for appointment. If they need a new referral - let me know.  [address] [place], [place] [address] [phone]  Continue same dose of amlodipine for now.   I would not recommend any medications for weight loss at this time until those have been cleared by your nephrologist.  See information on adjustment disorder below. Return to the clinic or go to the nearest emergency room if any of your symptoms worsen or new symptoms occur.  Adjustment Disorder, Adult Adjustment disorder is a group of symptoms that can develop after a stressful life event, such as the loss of a job or serious physical illness. The symptoms can affect how you feel, think, and act. They may interfere with your relationships. Adjustment disorder increases your risk of suicide and substance abuse. If this disorder is not managed early, it can develop into a more serious condition, such as major depressive disorder or post-traumatic stress disorder. What are the causes? This condition happens when you have trouble recovering from or coping with a stressful life event. What increases the risk? You are more likely to develop this condition if:  You have had depression or anxiety.  You are being treated for a long-term (chronic) illness.  You are being treated for an illness that cannot be cured (terminal illness).  You have a family history of mental illness.  What are the signs or symptoms? Symptoms of this condition include:  Extreme trouble doing daily tasks, such as going to work.  Sadness, depression, or crying spells.  Worrying a lot.  Loss of enjoyment.  Change in appetite or weight.  Feelings of loss or hopelessness.  Thoughts of suicide.  Anxiety, worry, or nervousness.  Trouble sleeping.  Avoiding family and friends.  Fighting or vandalism.  Complaining of feeling sick without being ill.  Feeling dazed or disconnected.  Nightmares.  Trouble  sleeping.  Irritability.  Reckless driving.  Poor work Systems analyst.  Ignoring bills.  Symptoms of this condition start within three months of the stressful event. They do not last more than six months, unless the stressful circumstances last longer. Normal grieving after the death of a loved one is not a symptom of this condition. How is this diagnosed? To diagnose this condition, your health care provider will ask about what has happened in your life and how it has affected you. He or she may also ask about your medical history and your use of medicines, alcohol, and other substances. Your health care provider may do a physical exam and order lab tests or other studies. You may be referred to a mental health specialist. How is this treated? Treatment options for this condition include:  Counseling or talk therapy. Talk therapy is usually provided by mental health specialists.  Medicines. Certain medicines may help with depression, anxiety, and sleep.  Support groups. These offer emotional support, advice, and guidance. They are made up of people who have had similar experiences.  Observation and time. This is sometimes called "watchful waiting." In this treatment, health care providers monitor your health and behavior without other treatment. Adjustment disorder sometimes gets better on its own with time.  Follow these instructions at home:  Take over-the-counter and prescription medicines only as told by your health care provider.  Keep all follow-up visits as told by your health care provider. This is important. Contact a health care provider if:  Your symptoms do not improve in six months.  Your symptoms get worse. Get help right away  if:  You have serious thoughts about hurting yourself or someone else. If you ever feel like you may hurt yourself or others, or have thoughts about taking your own life, get help right away. You can go to your nearest emergency department or  call:  Your local emergency services (911 in the [place]  A suicide crisis helpline, such as the [facility] at 640-699-9154. This is open 24 hours a day.  Summary  Adjustment disorder is a group of symptoms that can develop after a stressful life event, such as the loss of a job or serious physical illness. The symptoms can affect how you feel, think, and act. They may interfere with your relationships.  Symptoms of this condition start within three months of the stressful event. They do not last more than six months, unless the stressful circumstances last longer.  Treatment may include talk therapy, medicines, participation in a support group, or observation to see if symptoms improve.  Contact your health care provider if your symptoms get worse or do not improve in six months.  If you ever feel like you may hurt yourself or others, or have thoughts about taking your own life, get help right away. This information is not intended to replace advice given to you by your health care provider. Make sure you discuss any questions you have with your health care provider. Document Released: 08/30/2005 Document Revised: 02/25/2016 Document Reviewed: 02/25/2016 Elsevier Interactive Patient Education  2018 [place] in 3 months.       IF you received an x-ray today, you will receive an invoice from [facility]. Please contact [facility] at [phone] with questions or concerns regarding your invoice.   IF you received labwork today, you will receive an invoice from [facility]. Please contact [facility] at [phone] with questions or concerns regarding your invoice.   Our billing staff will not be able to assist you with questions regarding bills from these companies.  You will be contacted with the lab results as soon as they are available. The fastest way to get your results is to activate your My Chart account. Instructions  are located on the last page of this paperwork. If you have not heard from [place] regarding the results in 2 weeks, please contact this office.

## 2017-02-13 NOTE — Progress Notes (Signed)
Subjective:  By signing my name below, I, [name], attest that this documentation has been prepared under the direction and in the presence of Wendie Agreste, MD Electronically Signed: Ladene Artist, ED Scribe 02/13/2017 at 10:13 AM.   Patient ID: [name], female    DOB: Jul 19, 1980, 37 y.o.   MRN: [id number]  Chief Complaint  Patient presents with  . Headache    1 week follow-up   . Hypertension   HPI [name] is a 36 y.o. female who presents to Primary Care at [facility] for f/u. Last seen 1/25 with HA. Possible sinusitis vs stress HA. Uncontrolled HTN HA and adjustment disorder. She has a h/o CKD without recent nephrology f/u and had been nonadherent to meds over the past 4 yrs. Had not seen nephrology in 4 yrs. Started Norvasc 5 mg qd, sinusitis treated with Augmentin. Phone number for counselor. Normal TSH. Kidney function had increased from 1.54 in March 2018 to 1.78 last visit. She was seen on 1/30 with a rash on L lower neck, treated with triamcinolone.  Pt states that HA has been minuet after quitting caffeine on 2/1. She had been drinking 3-4 Mountain Dews/day. Pt did not start Augmentin.  HTN Pt has been compliant with amlodipine 5 mg qd. BP was 124/84 at OV on 1/30. Denies cp, sob, difficutly urinating, new side-effects. She has also noticed some weight gain and wants to start phentermine. 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 Nephrologist: Dr. Lowanda Foster in [place] with [facility].  Adjustment Disorder She has not contacted a counselor yet. Pt suspects that stress is self-induced and due to all the new information she has received regarding CKD. She is still experiencing a twitch to her R eye which she attributes to stress. Denies difficulty sleeping, SI.  Patient Active Problem List   Diagnosis Date Noted  . Tobacco use disorder 04/06/2016  . Chronic kidney disease 04/06/2016   Past  Medical History:  Diagnosis Date  . Allergy   . Chronic kidney disease 2009  . Pre-eclampsia    Past Surgical History:  Procedure Laterality Date  . TOOTH EXTRACTION     No Known Allergies Prior to Admission medications   Medication Sig Start Date End Date Taking? Authorizing Provider  Acetaminophen (TYLENOL PO) Take by mouth.    [provider]  amLODipine (NORVASC) 5 MG tablet Take 1 tablet (5 mg total) by mouth daily. 02/02/17   Wendie Agreste, MD  amoxicillin-clavulanate (AUGMENTIN) 875-125 MG tablet Take 1 tablet by mouth 2 (two) times daily. Patient not taking: Reported on 02/07/2017 02/02/17   Wendie Agreste, MD  cetirizine (ZYRTEC) 10 MG tablet Take 1 tablet (10 mg total) by mouth daily. Patient not taking: Reported on 02/02/2017 03/27/16   Jaynee Eagles, PA-C  triamcinolone cream (KENALOG) 0.1 % Apply 1 application topically 2 (two) times daily. 02/07/17   Horald Pollen, MD   Social History   Socioeconomic History  . Marital status: Married    Spouse name: Not on file  . Number of children: Not on file  . Years of education: Not on file  . Highest education level: Not on file  Social Needs  . Financial resource strain: Not on file  . Food insecurity - worry: Not on file  . Food insecurity - inability: Not on file  . Transportation needs - medical: Not on file  . Transportation needs - non-medical: Not  on file  Occupational History  . Not on file  Tobacco Use  . Smoking status: Current Every Day Smoker    Packs/day: 0.25    Types: Cigarettes  . Smokeless tobacco: Never Used  Substance and Sexual Activity  . Alcohol use: Not on file  . Drug use: Not on file  . Sexual activity: Not on file  Other Topics Concern  . Not on file  Social History Narrative  . Not on file   Review of Systems  Respiratory: Negative for shortness of breath.   Cardiovascular: Negative for chest pain.  Genitourinary: Negative for difficulty urinating.  Neurological:  Negative for headaches (resolved).  Psychiatric/Behavioral: Negative for sleep disturbance and suicidal ideas.      Objective:   Physical Exam  Constitutional: She is oriented to person, place, and time. She appears well-developed and well-nourished.  HENT:  Head: Normocephalic and atraumatic.  Eyes: Conjunctivae and EOM are normal. Pupils are equal, round, and reactive to light.  Neck: Carotid bruit is not present.  Cardiovascular: Normal rate, regular rhythm, normal heart sounds and intact distal pulses.  Pulmonary/Chest: Effort normal and breath sounds normal.  Abdominal: Soft. She exhibits no pulsatile midline mass. There is no tenderness.  Neurological: She is alert and oriented to person, place, and time.  Skin: Skin is warm and dry.  Psychiatric: She has a normal mood and affect. Her behavior is normal.  Vitals reviewed.  Vitals:   02/13/17 0950  BP: 122/78  Pulse: 78  Resp: 16  Temp: 98.5 F (36.9 C)  TempSrc: Oral  SpO2: 97%  Weight: 171 lb (77.6 kg)  Height: 5' 1.61" (1.565 m)      Assessment & Plan:  [name] is a 37 y.o. female CKD (chronic kidney disease) stage 3, GFR 30-59 ml/min (HCC)  - Lost to follow-up past few years. Discussed slight elevation in creatinine from previous reading last year. Still appears to be stage III chronic kidney disease. Importance of follow-up with nephrologist discussed for other testing/evaluation.  - Advised her to call her nephrologist, but if referral is needed, I can order that without office visit  - she mentioned weight loss medication, would like her to meet with nephrologist first to determine safety of different medications, then consider medical bariatric evaluation.  Essential hypertension - Plan: amLODipine (NORVASC) 5 MG tablet  - Improved control on amlodipine. Hold on further changes until she has seen nephrology  Adjustment disorder, unspecified type  - Handout given again, stress management techniques  discussed. Overall is improving.  Meds ordered this encounter  Medications  . amLODipine (NORVASC) 5 MG tablet    Sig: Take 1 tablet (5 mg total) by mouth daily.    Dispense:  90 tablet    Refill:  1   Patient Instructions   Call your nephrologist for appointment. If they need a new referral - let me know.  [address], [place], [place] [address] [phone]  Continue same dose of amlodipine for now.   I would not recommend any medications for weight loss at this time until those have been cleared by your nephrologist.  See information on adjustment disorder below. Return to the clinic or go to the nearest emergency room if any of your symptoms worsen or new symptoms occur.  Adjustment Disorder, Adult Adjustment disorder is a group of symptoms that can develop after a stressful life event, such as the loss of a job or serious physical illness. The symptoms can affect how  you feel, think, and act. They may interfere with your relationships. Adjustment disorder increases your risk of suicide and substance abuse. If this disorder is not managed early, it can develop into a more serious condition, such as major depressive disorder or post-traumatic stress disorder. What are the causes? This condition happens when you have trouble recovering from or coping with a stressful life event. What increases the risk? You are more likely to develop this condition if:  You have had depression or anxiety.  You are being treated for a long-term (chronic) illness.  You are being treated for an illness that cannot be cured (terminal illness).  You have a family history of mental illness.  What are the signs or symptoms? Symptoms of this condition include:  Extreme trouble doing daily tasks, such as going to work.  Sadness, depression, or crying spells.  Worrying a lot.  Loss of enjoyment.  Change in appetite or weight.  Feelings of loss or hopelessness.  Thoughts of  suicide.  Anxiety, worry, or nervousness.  Trouble sleeping.  Avoiding family and friends.  Fighting or vandalism.  Complaining of feeling sick without being ill.  Feeling dazed or disconnected.  Nightmares.  Trouble sleeping.  Irritability.  Reckless driving.  Poor work Systems analyst.  Ignoring bills.  Symptoms of this condition start within three months of the stressful event. They do not last more than six months, unless the stressful circumstances last longer. Normal grieving after the death of a loved one is not a symptom of this condition. How is this diagnosed? To diagnose this condition, your health care provider will ask about what has happened in your life and how it has affected you. He or she may also ask about your medical history and your use of medicines, alcohol, and other substances. Your health care provider may do a physical exam and order lab tests or other studies. You may be referred to a mental health specialist. How is this treated? Treatment options for this condition include:  Counseling or talk therapy. Talk therapy is usually provided by mental health specialists.  Medicines. Certain medicines may help with depression, anxiety, and sleep.  Support groups. These offer emotional support, advice, and guidance. They are made up of people who have had similar experiences.  Observation and time. This is sometimes called "watchful waiting." In this treatment, health care providers monitor your health and behavior without other treatment. Adjustment disorder sometimes gets better on its own with time.  Follow these instructions at home:  Take over-the-counter and prescription medicines only as told by your health care provider.  Keep all follow-up visits as told by your health care provider. This is important. Contact a health care provider if:  Your symptoms do not improve in six months.  Your symptoms get worse. Get help right away if:  You have  serious thoughts about hurting yourself or someone else. If you ever feel like you may hurt yourself or others, or have thoughts about taking your own life, get help right away. You can go to your nearest emergency department or call:  Your local emergency services (911 in the [place]  A suicide crisis helpline, such as the [facility] at 343 459 0924. This is open 24 hours a day.  Summary  Adjustment disorder is a group of symptoms that can develop after a stressful life event, such as the loss of a job or serious physical illness. The symptoms can affect how you feel, think, and act. They may interfere  with your relationships.  Symptoms of this condition start within three months of the stressful event. They do not last more than six months, unless the stressful circumstances last longer.  Treatment may include talk therapy, medicines, participation in a support group, or observation to see if symptoms improve.  Contact your health care provider if your symptoms get worse or do not improve in six months.  If you ever feel like you may hurt yourself or others, or have thoughts about taking your own life, get help right away. This information is not intended to replace advice given to you by your health care provider. Make sure you discuss any questions you have with your health care provider. Document Released: 08/30/2005 Document Revised: 02/25/2016 Document Reviewed: 02/25/2016 Elsevier Interactive Patient Education  2018 [place] in 3 months.       IF you received an x-ray today, you will receive an invoice from [facility]. Please contact [facility] at [phone] with questions or concerns regarding your invoice.   IF you received labwork today, you will receive an invoice from [facility]. Please contact [facility] at [phone] with questions or concerns regarding your invoice.   Our billing staff will not be able to  assist you with questions regarding bills from these companies.  You will be contacted with the lab results as soon as they are available. The fastest way to get your results is to activate your My Chart account. Instructions are located on the last page of this paperwork. If you have not heard from [place] regarding the results in 2 weeks, please contact this office.       I personally performed the services described in this documentation, which was scribed in my presence. The recorded information has been reviewed and considered for accuracy and completeness, addended by me as needed, and agree with information above.  Signed,   Merri Ray, MD [facility].  02/13/17 12:52 PM

## 2017-04-11 ENCOUNTER — Other Ambulatory Visit: Payer: Self-pay | Admitting: Urgent Care

## 2017-07-09 ENCOUNTER — Encounter: Payer: Self-pay | Admitting: Gastroenterology

## 2017-10-04 ENCOUNTER — Ambulatory Visit: Payer: Self-pay | Admitting: Nurse Practitioner

## 2017-12-11 ENCOUNTER — Other Ambulatory Visit: Payer: Self-pay | Admitting: *Deleted

## 2017-12-11 ENCOUNTER — Ambulatory Visit: Payer: BC Managed Care – PPO | Admitting: Nurse Practitioner

## 2017-12-11 ENCOUNTER — Encounter: Payer: Self-pay | Admitting: Nurse Practitioner

## 2017-12-11 ENCOUNTER — Telehealth: Payer: Self-pay

## 2017-12-11 VITALS — BP 151/101 | HR 71 | Temp 97.7°F | Ht 61.0 in | Wt 173.8 lb

## 2017-12-11 DIAGNOSIS — K649 Unspecified hemorrhoids: Secondary | ICD-10-CM

## 2017-12-11 DIAGNOSIS — N189 Chronic kidney disease, unspecified: Secondary | ICD-10-CM

## 2017-12-11 DIAGNOSIS — F17200 Nicotine dependence, unspecified, uncomplicated: Secondary | ICD-10-CM | POA: Diagnosis not present

## 2017-12-11 DIAGNOSIS — K625 Hemorrhage of anus and rectum: Secondary | ICD-10-CM | POA: Diagnosis not present

## 2017-12-11 MED ORDER — HYDROCORTISONE 2.5 % RE CREA: 1.0000 "application " | TOPICAL_CREAM | Freq: Two times a day (BID) | RECTAL | 1 refills | Status: DC

## 2017-12-11 NOTE — Patient Instructions (Signed)
1. We will schedule your colonoscopy for you. 2. We will reach out to your nephrologist to get the okay/clearance to proceed. 3. I have sent Anusol rectal cream to your pharmacy.  You can use this up to twice a day, for up to 10 days at a time for hemorrhoid symptoms. 4. Return for follow-up in 4 months. 5. I have referred you to the medical weight loss clinic and Dr. Renette Butters 6. Call us if you have any questions or concerns.  At [facility] we value your feedback. You may receive a survey about your visit today. Please share your experience as we strive to create trusting relationships with our patients to provide genuine, compassionate, quality care.  We appreciate your understanding and patience as we review any laboratory studies, imaging, and other diagnostic tests that are ordered as we care for you. Our office policy is 5 business days for review of these results, and any emergent or urgent results are addressed in a timely manner for your best interest. If you do not hear from our office in 1 week, please contact us.   We also encourage the use of MyChart, which contains your medical information for your review as well. If you are not enrolled in this feature, an access code is on this after visit summary for your convenience. Thank you for allowing [place] to be involved in your care.  It was great to see you today!  I hope you have a Merry Christmas!!

## 2017-12-11 NOTE — Assessment & Plan Note (Signed)
The patient continues to smoke despite her stage III kidney disease.  She states every time she tries to quit smoking she gained weight and with her kidney disease it is difficult to lose weight.  Per her request, I will refer her to the medical weight loss clinic with Dr. Renette Butters for possible weight loss options with underlying stage III chronic kidney disease.  Follow-up in 4 months.

## 2017-12-11 NOTE — Progress Notes (Signed)
Primary Care Physician:  Celene Squibb, MD Primary Gastroenterologist:  Dr. Oneida Alar  Chief Complaint  Patient presents with  . Hemorrhoids    HPI:   [name] is a 37 y.o. female who presents on referral from primary care for evaluation of hemorrhoids.  Reviewed information provided with referral including office visit dated 06/29/2017 which was an appointment for routine physical.  No mention of problematic hemorrhoids at that time.  Patient does have a history of chronic kidney disease stage III and sees nephrology.  The patient also has hyperlipidemia and lifestyle changes were discussed at that time as well.  No history of colonoscopy in our system.  Today she states she's doing ok. Thinks she has hemorrhoids. Has used Preparation H without relief. One particular area seems more swollen/protruding; also generalized swelling in the whole area. Has some rectal burning during a bowel movement; also with rectal itching. When the area gets irritated with had scant toilet tissue hematochezia. Denies abdominal pain, N/V, melena, fever, chills, unintentional weight loss. Denies chest pain, dyspnea, dizziness, lightheadedness, syncope, near syncope. Denies any other upper or lower GI symptoms.  She has, what seems to be, progressive kidney disease from unknown etiology; initially diagnosed at age 74.  Past Medical History:  Diagnosis Date  . Allergy   . Chronic kidney disease 2009  . Pre-eclampsia     Past Surgical History:  Procedure Laterality Date  . TOOTH EXTRACTION      Current Outpatient Medications  Medication Sig Dispense Refill  . Acetaminophen (TYLENOL PO) Take by mouth.    Marland Kitchen lisinopril (PRINIVIL,ZESTRIL) 20 MG tablet Take 1 tablet by mouth daily.     No current facility-administered medications for this visit.     Allergies as of 12/11/2017 - Review Complete 12/11/2017  Allergen Reaction Noted  . Bactrim [sulfamethoxazole-trimethoprim]  12/11/2017    Family  History  Problem Relation Age of Onset  . Colon cancer Neg Hx     Social History   Socioeconomic History  . Marital status: Married    Spouse name: Not on file  . Number of children: Not on file  . Years of education: Not on file  . Highest education level: Not on file  Occupational History  . Not on file  Social Needs  . Financial resource strain: Not on file  . Food insecurity:    Worry: Not on file    Inability: Not on file  . Transportation needs:    Medical: Not on file    Non-medical: Not on file  Tobacco Use  . Smoking status: Current Every Day Smoker    Packs/day: 0.25    Types: Cigarettes  . Smokeless tobacco: Never Used  Substance and Sexual Activity  . Alcohol use: Yes    Comment: rare  . Drug use: Never  . Sexual activity: Not on file  Lifestyle  . Physical activity:    Days per week: Not on file    Minutes per session: Not on file  . Stress: Not on file  Relationships  . Social connections:    Talks on phone: Not on file    Gets together: Not on file    Attends religious service: Not on file    Active member of club or organization: Not on file    Attends meetings of clubs or organizations: Not on file    Relationship status: Not on file  . Intimate partner violence:    Fear of current or ex partner:  Not on file    Emotionally abused: Not on file    Physically abused: Not on file    Forced sexual activity: Not on file  Other Topics Concern  . Not on file  Social History Narrative  . Not on file    Review of Systems: Complete ROS negative except as per HPI.    Physical Exam: BP (!) 151/101   Pulse 71   Temp 97.7 F (36.5 C) (Oral)   Ht 5\' 1"  (1.549 m)   Wt 173 lb 12.8 oz (78.8 kg)   BMI 32.84 kg/m  General:   Alert and oriented. Pleasant and cooperative. Well-nourished and well-developed.  Eyes:  Without icterus, sclera clear and conjunctiva pink.  Ears:  Normal auditory acuity. Cardiovascular:  S1, S2 present without murmurs  appreciated. Normal pulses noted. Extremities without clubbing or edema. Respiratory:  Clear to auscultation bilaterally. No wheezes, rales, or rhonchi. No distress.  Gastrointestinal:  +BS, soft, non-tender and non-distended. No HSM noted. No guarding or rebound. No masses appreciated.  Rectal:  Deferred  Musculoskalatal:  Symmetrical without gross deformities. Skin:  Intact without significant lesions or rashes. Neurologic:  Alert and oriented x4;  grossly normal neurologically. Psych:  Alert and cooperative. Normal mood and affect. Heme/Lymph/Immune: No excessive bruising noted.    12/11/2017 11:12 AM   Disclaimer: This note was dictated with voice recognition software. Similar sounding words can inadvertently be transcribed and may not be corrected upon review.

## 2017-12-11 NOTE — Progress Notes (Signed)
cc'ed to pcp °

## 2017-12-11 NOTE — Assessment & Plan Note (Signed)
The patient feels she may have hemorrhoids.  She does have rectal burning, itching.  She does have scant toilet tissue hematochezia as per above.  Her primary care did not see external hemorrhoids.  There is a possibility she has internal hemorrhoids.  Preparation H/over the counter medications have not helped.  I will send in a prescription for Anusol cream.  We will plan for colonoscopy as per above.  Follow-up in 4 months.

## 2017-12-11 NOTE — Telephone Encounter (Signed)
Dr. Cristela Felt, [place], NP would like clearance for pt to have a colonoscopy with Propofol/MAC. Please advise.

## 2017-12-11 NOTE — Assessment & Plan Note (Signed)
The patient has rectal bleeding on the toilet tissue.  She feels she likely has hemorrhoids but she is not sure.  She states a rectal exam at her primary care office did not find external hemorrhoids.  Her bleeding is intermittent.  She does have rectal irritation, fullness, swelling, burning.  She is 37 years old, has never had a colonoscopy.  Given her rectal bleeding we will err on the side of caution and plan for colonoscopy.  She is requesting possible hemorrhoid banding, if amendable.  Return for follow-up in 4 months.  Proceed with colonoscopy +/- hemorrhoid banding on propofol/MAC with Dr. Oneida Alar in the near future. The risks, benefits, and alternatives have been discussed in detail with the patient. They state understanding and desire to proceed.   The patient is not on any anticoagulants, anxiolytics, chronic pain medications, or antidepressants.  She does have stage III chronic kidney disease of unknown etiology, diagnosed age 89.  We will plan for the procedure on propofol/MAC for close medical monitoring giving her comorbidities.  Will reach out to her nephrologist for clearance.

## 2018-01-14 ENCOUNTER — Other Ambulatory Visit: Payer: Self-pay

## 2018-01-14 ENCOUNTER — Telehealth: Payer: Self-pay

## 2018-01-14 DIAGNOSIS — K625 Hemorrhage of anus and rectum: Secondary | ICD-10-CM

## 2018-01-14 DIAGNOSIS — K649 Unspecified hemorrhoids: Secondary | ICD-10-CM

## 2018-01-14 MED ORDER — NA SULFATE-K SULFATE-MG SULF 17.5-3.13-1.6 GM/177ML PO SOLN: 1.0000 | ORAL | 0 refills | Status: DC

## 2018-01-14 NOTE — Telephone Encounter (Signed)
Pt called office, TCS/-/+hem banding w/Propofol w/SLF scheduled for 03/26/18 at 1:15pm. Rx for prep sent to pharmacy. Orders entered. Will mail instructions after pre-op appt is scheduled.

## 2018-01-14 NOTE — Telephone Encounter (Signed)
Lm for Dr. Festus Barren nurse, waiting to hear back if it's ok for pt to have TCS.

## 2018-01-14 NOTE — Addendum Note (Signed)
Addended by: Hassan Rowan on: 01/14/2018 04:45 PM   Modules accepted: Orders

## 2018-01-14 NOTE — Telephone Encounter (Signed)
Dr. Festus Barren nurse returned call. It's ok for pt to have TCS. Dr. Cristela Felt dictated a note that hasn't come to the nurse just yet but she wanted to call to let our office know that it's ok.

## 2018-01-14 NOTE — Telephone Encounter (Signed)
Tried to call pt, no answer, LMOVM for return call.  

## 2018-01-15 NOTE — Telephone Encounter (Signed)
Called and informed pt of pre-op appt 03/20/18 at 10:00am. Letter mailed with procedure instructions.

## 2018-03-18 NOTE — Patient Instructions (Signed)
[name]  03/18/2018     @PREFPERIOPPHARMACY @   Your procedure is scheduled on  03/26/2018.  Report to [facility] at  1145  A.M.  Call this number if you have problems the morning of surgery:  [phone]   Remember:  Follow the diet and prep instructions given to you by Dr Nona Dell office.                       Take these medicines the morning of surgery with A SIP OF WATER  Zyrtec.    Do not wear jewelry, make-up or nail polish.  Do not wear lotions, powders, or perfumes, or deodorant.  Do not shave 48 hours prior to surgery.  Men may shave face and neck.  Do not bring valuables to the hospital.  [facility] is not responsible for any belongings or valuables.  Contacts, dentures or bridgework may not be worn into surgery.  Leave your suitcase in the car.  After surgery it may be brought to your room.  For patients admitted to the hospital, discharge time will be determined by your treatment team.  Patients discharged the day of surgery will not be allowed to drive home.   Name and phone number of your driver:  family Special instructions:  None  Please read over the following fact sheets that you were given. Anesthesia Post-op Instructions and Care and Recovery After Surgery       Colonoscopy, Adult, Care After This sheet gives you information about how to care for yourself after your procedure. Your health care provider may also give you more specific instructions. If you have problems or questions, contact your health care provider. What can I expect after the procedure? After the procedure, it is common to have:  A small amount of blood in your stool for 24 hours after the procedure.  Some gas.  Mild abdominal cramping or bloating. Follow these instructions at home: General instructions  For the first 24 hours after the procedure: ? Do not drive or use machinery. ? Do not sign important documents. ? Do not drink alcohol. ? Do your regular  daily activities at a slower pace than normal. ? Eat soft, easy-to-digest foods.  Take over-the-counter or prescription medicines only as told by your health care provider. Relieving cramping and bloating   Try walking around when you have cramps or feel bloated.  Apply heat to your abdomen as told by your health care provider. Use a heat source that your health care provider recommends, such as a moist heat pack or a heating pad. ? Place a towel between your skin and the heat source. ? Leave the heat on for 20-30 minutes. ? Remove the heat if your skin turns bright red. This is especially important if you are unable to feel pain, heat, or cold. You may have a greater risk of getting burned. Eating and drinking   Drink enough fluid to keep your urine pale yellow.  Resume your normal diet as instructed by your health care provider. Avoid heavy or fried foods that are hard to digest.  Avoid drinking alcohol for as long as instructed by your health care provider. Contact a health care provider if:  You have blood in your stool 2-3 days after the procedure. Get help right away if:  You have more than a small spotting of blood in your stool.  You pass large blood clots in your stool.  Your abdomen is swollen.  You have nausea or vomiting.  You have a fever.  You have increasing abdominal pain that is not relieved with medicine. Summary  After the procedure, it is common to have a small amount of blood in your stool. You may also have mild abdominal cramping and bloating.  For the first 24 hours after the procedure, do not drive or use machinery, sign important documents, or drink alcohol.  Contact your health care provider if you have a lot of blood in your stool, nausea or vomiting, a fever, or increased abdominal pain. This information is not intended to replace advice given to you by your health care provider. Make sure you discuss any questions you have with your health  care provider. Document Released: 08/10/2003 Document Revised: 10/18/2016 Document Reviewed: 03/09/2015 Elsevier Interactive Patient Education  2019 [place], Care After These instructions provide you with information about caring for yourself after your procedure. Your health care provider may also give you more specific instructions. Your treatment has been planned according to current medical practices, but problems sometimes occur. Call your health care provider if you have any problems or questions after your procedure. What can I expect after the procedure? After your procedure, you may:  Feel sleepy for several hours.  Feel clumsy and have poor balance for several hours.  Feel forgetful about what happened after the procedure.  Have poor judgment for several hours.  Feel nauseous or vomit.  Have a sore throat if you had a breathing tube during the procedure. Follow these instructions at home: For at least 24 hours after the procedure:      Have a responsible adult stay with you. It is important to have someone help care for you until you are awake and alert.  Rest as needed.  Do not: ? Participate in activities in which you could fall or become injured. ? Drive. ? Use heavy machinery. ? Drink alcohol. ? Take sleeping pills or medicines that cause drowsiness. ? Make important decisions or sign legal documents. ? Take care of children on your own. Eating and drinking  Follow the diet that is recommended by your health care provider.  If you vomit, drink water, juice, or soup when you can drink without vomiting.  Make sure you have little or no nausea before eating solid foods. General instructions  Take over-the-counter and prescription medicines only as told by your health care provider.  If you have sleep apnea, surgery and certain medicines can increase your risk for breathing problems. Follow instructions from your health care  provider about wearing your sleep device: ? Anytime you are sleeping, including during daytime naps. ? While taking prescription pain medicines, sleeping medicines, or medicines that make you drowsy.  If you smoke, do not smoke without supervision.  Keep all follow-up visits as told by your health care provider. This is important. Contact a health care provider if:  You keep feeling nauseous or you keep vomiting.  You feel light-headed.  You develop a rash.  You have a fever. Get help right away if:  You have trouble breathing. Summary  For several hours after your procedure, you may feel sleepy and have poor judgment.  Have a responsible adult stay with you for at least 24 hours or until you are awake and alert. This information is not intended to replace advice given to you by your health care provider. Make sure you discuss any questions you have with your health  care provider. Document Released: 04/18/2015 Document Revised: 08/11/2016 Document Reviewed: 04/18/2015 Elsevier Interactive Patient Education  2019 [employer].

## 2018-03-20 ENCOUNTER — Other Ambulatory Visit: Payer: Self-pay

## 2018-03-20 ENCOUNTER — Encounter (HOSPITAL_COMMUNITY): Payer: Self-pay

## 2018-03-20 ENCOUNTER — Encounter (HOSPITAL_COMMUNITY)
Admission: RE | Admit: 2018-03-20 | Discharge: 2018-03-20 | Disposition: A | Discharge status: Home / Self Care | Payer: BC Managed Care – PPO | Source: Ambulatory Visit | Attending: Gastroenterology | Admitting: Gastroenterology

## 2018-03-20 DIAGNOSIS — Z01812 Encounter for preprocedural laboratory examination: Secondary | ICD-10-CM | POA: Insufficient documentation

## 2018-03-20 LAB — BASIC METABOLIC PANEL
ANION GAP: 7 (ref 5–15)
BUN: 25 mg/dL — ABNORMAL HIGH (ref 6–20)
CO2: 20 mmol/L — ABNORMAL LOW (ref 22–32)
Calcium: 9.3 mg/dL (ref 8.9–10.3)
Chloride: 109 mmol/L (ref 98–111)
Creatinine, Ser: 1.99 mg/dL — ABNORMAL HIGH (ref 0.44–1.00)
GFR calc non Af Amer: 32 mL/min — ABNORMAL LOW (ref >60)
GFR, EST AFRICAN AMERICAN: 37 mL/min — AB (ref >60)
Glucose, Bld: 84 mg/dL (ref 70–99)
Potassium: 5 mmol/L (ref 3.5–5.1)
Sodium: 136 mmol/L (ref 135–145)

## 2018-03-20 LAB — PREGNANCY, URINE: Preg Test, Ur: NEGATIVE

## 2018-03-26 ENCOUNTER — Ambulatory Visit (HOSPITAL_COMMUNITY): Payer: BC Managed Care – PPO | Admitting: Anesthesiology

## 2018-03-26 ENCOUNTER — Telehealth: Payer: Self-pay

## 2018-03-26 ENCOUNTER — Encounter (HOSPITAL_COMMUNITY)
Admission: RE | Disposition: A | Discharge status: Home / Self Care | Payer: Self-pay | Source: Home / Self Care | Attending: Gastroenterology

## 2018-03-26 ENCOUNTER — Encounter (HOSPITAL_COMMUNITY): Payer: Self-pay | Admitting: *Deleted

## 2018-03-26 ENCOUNTER — Ambulatory Visit (HOSPITAL_COMMUNITY)
Admission: RE | Admit: 2018-03-26 | Discharge: 2018-03-26 | Disposition: A | Discharge status: Home / Self Care | Payer: BC Managed Care – PPO | Attending: Gastroenterology | Admitting: Gastroenterology

## 2018-03-26 ENCOUNTER — Other Ambulatory Visit: Payer: Self-pay

## 2018-03-26 DIAGNOSIS — Z79899 Other long term (current) drug therapy: Secondary | ICD-10-CM | POA: Diagnosis not present

## 2018-03-26 DIAGNOSIS — N189 Chronic kidney disease, unspecified: Secondary | ICD-10-CM | POA: Diagnosis not present

## 2018-03-26 DIAGNOSIS — K644 Residual hemorrhoidal skin tags: Secondary | ICD-10-CM | POA: Diagnosis not present

## 2018-03-26 DIAGNOSIS — F17210 Nicotine dependence, cigarettes, uncomplicated: Secondary | ICD-10-CM | POA: Insufficient documentation

## 2018-03-26 DIAGNOSIS — Q439 Congenital malformation of intestine, unspecified: Secondary | ICD-10-CM | POA: Insufficient documentation

## 2018-03-26 DIAGNOSIS — K621 Rectal polyp: Secondary | ICD-10-CM | POA: Insufficient documentation

## 2018-03-26 DIAGNOSIS — K6289 Other specified diseases of anus and rectum: Secondary | ICD-10-CM | POA: Insufficient documentation

## 2018-03-26 DIAGNOSIS — K625 Hemorrhage of anus and rectum: Secondary | ICD-10-CM

## 2018-03-26 DIAGNOSIS — I129 Hypertensive chronic kidney disease with stage 1 through stage 4 chronic kidney disease, or unspecified chronic kidney disease: Secondary | ICD-10-CM | POA: Diagnosis not present

## 2018-03-26 DIAGNOSIS — Z975 Presence of (intrauterine) contraceptive device: Secondary | ICD-10-CM | POA: Diagnosis not present

## 2018-03-26 DIAGNOSIS — K649 Unspecified hemorrhoids: Secondary | ICD-10-CM

## 2018-03-26 DIAGNOSIS — K648 Other hemorrhoids: Secondary | ICD-10-CM | POA: Diagnosis not present

## 2018-03-26 DIAGNOSIS — K921 Melena: Secondary | ICD-10-CM | POA: Diagnosis not present

## 2018-03-26 HISTORY — PX: COLONOSCOPY WITH PROPOFOL: SHX5780

## 2018-03-26 HISTORY — PX: POLYPECTOMY: SHX5525

## 2018-03-26 HISTORY — PX: HEMORRHOID BANDING: SHX5850

## 2018-03-26 SURGERY — COLONOSCOPY WITH PROPOFOL
Anesthesia: General

## 2018-03-26 MED ORDER — KETAMINE HCL 10 MG/ML IJ SOLN
INTRAMUSCULAR | Status: DC | PRN
  Administered 2018-03-26 (×2): 10 mg via INTRAVENOUS

## 2018-03-26 MED ORDER — PROPOFOL 500 MG/50ML IV EMUL
INTRAVENOUS | Status: DC | PRN
  Administered 2018-03-26: 150 ug/kg/min via INTRAVENOUS

## 2018-03-26 MED ORDER — MIDAZOLAM HCL 2 MG/2ML IJ SOLN: 0.5000 mg | Freq: Once | INTRAMUSCULAR | Status: DC | PRN

## 2018-03-26 MED ORDER — HYDROMORPHONE HCL 1 MG/ML IJ SOLN: 0.2500 mg | INTRAMUSCULAR | Status: DC | PRN

## 2018-03-26 MED ORDER — HYDROCODONE-ACETAMINOPHEN 7.5-325 MG PO TABS: 1.0000 | ORAL_TABLET | Freq: Once | ORAL | Status: DC | PRN

## 2018-03-26 MED ORDER — CHLORHEXIDINE GLUCONATE CLOTH 2 % EX PADS: 6.0000 | MEDICATED_PAD | Freq: Once | CUTANEOUS | Status: DC

## 2018-03-26 MED ORDER — GLYCOPYRROLATE 0.2 MG/ML IJ SOLN
INTRAMUSCULAR | Status: DC | PRN
  Administered 2018-03-26: 0.2 mg via INTRAVENOUS

## 2018-03-26 MED ORDER — KETAMINE HCL 50 MG/5ML IJ SOSY
PREFILLED_SYRINGE | INTRAMUSCULAR | Status: AC
  Filled 2018-03-26: qty 5

## 2018-03-26 MED ORDER — STERILE WATER FOR IRRIGATION IR SOLN
Status: DC | PRN
  Administered 2018-03-26: 100 mL

## 2018-03-26 MED ORDER — PROPOFOL 10 MG/ML IV BOLUS
INTRAVENOUS | Status: AC
  Filled 2018-03-26: qty 20

## 2018-03-26 MED ORDER — PROMETHAZINE HCL 25 MG/ML IJ SOLN: 6.2500 mg | INTRAMUSCULAR | Status: DC | PRN

## 2018-03-26 MED ORDER — LIDOCAINE HCL (CARDIAC) PF 100 MG/5ML IV SOSY
PREFILLED_SYRINGE | INTRAVENOUS | Status: DC | PRN
  Administered 2018-03-26: 40 mg via INTRAVENOUS

## 2018-03-26 MED ORDER — PROPOFOL 10 MG/ML IV BOLUS
INTRAVENOUS | Status: DC | PRN
  Administered 2018-03-26 (×2): 20 mg via INTRAVENOUS

## 2018-03-26 MED ORDER — LACTATED RINGERS IV SOLN
INTRAVENOUS | Status: DC
  Administered 2018-03-26: 1000 mL via INTRAVENOUS

## 2018-03-26 NOTE — Discharge Instructions (Signed)
You had 2 small polyps removed. You have SMALL internal hemorrhoids so banding was not possible. YOU HAVE LARGE EXTERNAL HEMORRHOIDS.   DRINK WATER TO KEEP YOUR URINE LIGHT YELLOW.  CONTINUE YOUR WEIGHT LOSS EFFORTS. YOUR BODY MASS INDEX IS OVER 30 WHICH MEANS YOU ARE OBESE. OBESITY IS ASSOCIATED WITH AN INCREASED FOR CIRRHOSIS AND ALL CANCERS, INCLUDING ESOPHAGEAL AND COLON CANCER. A WEIGHT OF 155 LBS OR LESS  WILL GET YOUR BODY MASS INDEX(BMI) UNDER 30.  FOLLOW A HIGH FIBER DIET. AVOID ITEMS THAT CAUSE BLOATING & GAS. SEE INFO BELOW.   USE PREPARATION H UP 4 TIMES A DAY FOR 7 DAYS THEN AS NEEDED TO CONTROL RECTAL BLEEDING, ITCHING, PRESSURE, BURNING, OR PAIN.  SEE SURGERY TO FIX YOUR HEMORRHOIDS.    YOUR BIOPSY RESULTS WILL BE BACK IN 5 BUSINESS DAYS.  FOLLOW UP IN 4 MOS.   Next colonoscopy WILL BE DETERMINED AFTER THE PATHOLOGY REPORT IS FINAL.   Colonoscopy Care After Read the instructions outlined below and refer to this sheet in the next week. These discharge instructions provide you with general information on caring for yourself after you leave the hospital. While your treatment has been planned according to the most current medical practices available, unavoidable complications occasionally occur. If you have any problems or questions after discharge, call DR. Darrall Strey, [phone].  ACTIVITY  You may resume your regular activity, but move at a slower pace for the next 24 hours.   Take frequent rest periods for the next 24 hours.   Walking will help get rid of the air and reduce the bloated feeling in your belly (abdomen).   No driving for 24 hours (because of the medicine (anesthesia) used during the test).   You may shower.   Do not sign any important legal documents or operate any machinery for 24 hours (because of the anesthesia used during the test).    NUTRITION  Drink plenty of fluids.   You may resume your normal diet as instructed by your doctor.    Begin with a light meal and progress to your normal diet. Heavy or fried foods are harder to digest and may make you feel sick to your stomach (nauseated).   Avoid alcoholic beverages for 24 hours or as instructed.    MEDICATIONS  You may resume your normal medications.   WHAT YOU CAN EXPECT TODAY  Some feelings of bloating in the abdomen.   Passage of more gas than usual.   Spotting of blood in your stool or on the toilet paper  .  IF YOU HAD POLYPS REMOVED DURING THE COLONOSCOPY:  Eat a soft diet IF YOU HAVE NAUSEA, BLOATING, ABDOMINAL PAIN, OR VOMITING.    FINDING OUT THE RESULTS OF YOUR TEST Not all test results are available during your visit. DR. Oneida Alar WILL CALL YOU WITHIN 14 DAYS OF YOUR PROCEDUE WITH YOUR RESULTS. Do not assume everything is normal if you have not heard from DR. Syeda Prickett, CALL HER OFFICE AT [phone].  SEEK IMMEDIATE MEDICAL ATTENTION AND CALL THE OFFICE: [phone] IF:  You have more than a spotting of blood in your stool.   Your belly is swollen (abdominal distention).   You are nauseated or vomiting.   You have a temperature over 101F.   You have abdominal pain or discomfort that is severe or gets worse throughout the day.   High-Fiber Diet A high-fiber diet changes your normal diet to include more whole grains, legumes, fruits, and vegetables. Changes in the diet involve  replacing refined carbohydrates with unrefined foods. The calorie level of the diet is essentially unchanged. The Dietary Reference Intake (recommended amount) for adult males is 38 grams per day. For adult females, it is 25 grams per day. Pregnant and lactating women should consume 28 grams of fiber per day. Fiber is the intact part of a plant that is not broken down during digestion. Functional fiber is fiber that has been isolated from the plant to provide a beneficial effect in the body.  PURPOSE  Increase stool bulk.   Ease and regulate bowel movements.    Lower cholesterol.   REDUCE RISK OF COLON CANCER  INDICATIONS THAT YOU NEED MORE FIBER  Constipation and hemorrhoids.   Uncomplicated diverticulosis (intestine condition) and irritable bowel syndrome.   Weight management.   As a protective measure against hardening of the arteries (atherosclerosis), diabetes, and cancer.   GUIDELINES FOR INCREASING FIBER IN THE DIET  Start adding fiber to the diet slowly. A gradual increase of about 5 more grams (2 slices of whole-wheat bread, 2 servings of most fruits or vegetables, or 1 bowl of high-fiber cereal) per day is best. Too rapid an increase in fiber may result in constipation, flatulence, and bloating.   Drink enough water and fluids to keep your urine clear or pale yellow. Water, juice, or caffeine-free drinks are recommended. Not drinking enough fluid may cause constipation.   Eat a variety of high-fiber foods rather than one type of fiber.   Try to increase your intake of fiber through using high-fiber foods rather than fiber pills or supplements that contain small amounts of fiber.   The goal is to change the types of food eaten. Do not supplement your present diet with high-fiber foods, but replace foods in your present diet.   INCLUDE A VARIETY OF FIBER SOURCES  Replace refined and processed grains with whole grains, canned fruits with fresh fruits, and incorporate other fiber sources. White rice, white breads, and most bakery goods contain little or no fiber.   Brown whole-grain rice, buckwheat oats, and many fruits and vegetables are all good sources of fiber. These include: broccoli, Brussels sprouts, cabbage, cauliflower, beets, sweet potatoes, white potatoes (skin on), carrots, tomatoes, eggplant, squash, berries, fresh fruits, and dried fruits.   Cereals appear to be the richest source of fiber. Cereal fiber is found in whole grains and bran. Bran is the fiber-rich outer coat of cereal grain, which is largely removed in  refining. In whole-grain cereals, the bran remains. In breakfast cereals, the largest amount of fiber is found in those with "bran" in their names. The fiber content is sometimes indicated on the label.   You may need to include additional fruits and vegetables each day.   In baking, for 1 cup white flour, you may use the following substitutions:   1 cup whole-wheat flour minus 2 tablespoons.   1/2 cup white flour plus 1/2 cup whole-wheat flour.   Polyps, Colon  A polyp is extra tissue that grows inside your body. Colon polyps grow in the large intestine. The large intestine, also called the colon, is part of your digestive system. It is a long, hollow tube at the end of your digestive tract where your body makes and stores stool. Most polyps are not dangerous. They are benign. This means they are not cancerous. But over time, some types of polyps can turn into cancer. Polyps that are smaller than a pea are usually not harmful. But larger polyps could someday become  or may already be cancerous. To be safe, doctors remove all polyps and test them.   WHO GETS POLYPS? Anyone can get polyps, but certain people are more likely than others. You may have a greater chance of getting polyps if:  You are over 50.   You have had polyps before.   Someone in your family has had polyps.   Someone in your family has had cancer of the large intestine.   Find out if someone in your family has had polyps. You may also be more likely to get polyps if you:   Eat a lot of fatty foods   Smoke   Drink alcohol   Do not exercise  Eat too much   PREVENTION There is not one sure way to prevent polyps. You might be able to lower your risk of getting them if you:  Eat more fruits and vegetables and less fatty food.   Do not smoke.   Avoid alcohol.   Exercise every day.   Lose weight if you are overweight.   Eating more calcium and folate can also lower your risk of getting polyps. Some foods that are  rich in calcium are milk, cheese, and broccoli. Some foods that are rich in folate are chickpeas, kidney beans, and spinach.

## 2018-03-26 NOTE — Telephone Encounter (Signed)
Nurse at [facility] called office to make 4 month pp f/u. However, she is already scheduled for OV 04/23/18. Does appt 04/23/18 need to cancelled?

## 2018-03-26 NOTE — H&P (Signed)
Primary Care Physician:  Celene Squibb, MD Primary Gastroenterologist:  Dr. Oneida Alar  Pre-Procedure History & Physical: HPI:  [name] is a 38 y.o. female here for  BRBPR/RECTALPAIN/ITCHING.  Past Medical History:  Diagnosis Date  . Allergy   . Chronic kidney disease 2009  . Pre-eclampsia     Past Surgical History:  Procedure Laterality Date  . TOOTH EXTRACTION      Prior to Admission medications   Medication Sig Start Date End Date Taking? Authorizing Provider  cetirizine (ZYRTEC) 10 MG tablet Take 10 mg by mouth daily.   Yes [provider]  lisinopril (PRINIVIL,ZESTRIL) 20 MG tablet Take 20 mg by mouth daily.  10/03/17  Yes [provider]  Na Sulfate-K Sulfate-Mg Sulf (SUPREP BOWEL PREP KIT) 17.5-3.13-1.6 GM/177ML SOLN Take 1 kit by mouth as directed. 01/14/18  Yes Athony Coppa L, MD  hydrocortisone (ANUSOL-HC) 2.5 % rectal cream Place 1 application rectally 2 (two) times daily. As needed, for up to 10 days at a time Patient not taking: Reported on 03/13/2018 12/11/17   Carlis Stable, NP  levonorgestrel (MIRENA) 20 MCG/24HR IUD 1 each by Intrauterine route once.    [provider]    Allergies as of 01/14/2018 - Review Complete 12/11/2017  Allergen Reaction Noted  . Bactrim [sulfamethoxazole-trimethoprim]  12/11/2017    Family History  Problem Relation Age of Onset  . Colon cancer Neg Hx     Social History   Socioeconomic History  . Marital status: Married    Spouse name: Not on file  . Number of children: Not on file  . Years of education: Not on file  . Highest education level: Not on file  Occupational History  . Not on file  Social Needs  . Financial resource strain: Not on file  . Food insecurity:    Worry: Not on file    Inability: Not on file  . Transportation needs:    Medical: Not on file    Non-medical: Not on file  Tobacco Use  . Smoking status: Current Every Day Smoker    Packs/day: 0.25    Types: Cigarettes  .  Smokeless tobacco: Never Used  Substance and Sexual Activity  . Alcohol use: Yes    Comment: rare  . Drug use: Never  . Sexual activity: Not on file  Lifestyle  . Physical activity:    Days per week: Not on file    Minutes per session: Not on file  . Stress: Not on file  Relationships  . Social connections:    Talks on phone: Not on file    Gets together: Not on file    Attends religious service: Not on file    Active member of club or organization: Not on file    Attends meetings of clubs or organizations: Not on file    Relationship status: Not on file  . Intimate partner violence:    Fear of current or ex partner: Not on file    Emotionally abused: Not on file    Physically abused: Not on file    Forced sexual activity: Not on file  Other Topics Concern  . Not on file  Social History Narrative  . Not on file    Review of Systems: See HPI, otherwise negative ROS   Physical Exam: BP (!) 138/91   Pulse 78   Temp 98.5 F (36.9 C) (Oral)   Resp (!) 27  General:   Alert,  pleasant and cooperative in  NAD Head:  Normocephalic and atraumatic. Neck:  Supple; Lungs:  Clear throughout to auscultation.    Heart:  Regular rate and rhythm. Abdomen:  Soft, nontender and nondistended. Normal bowel sounds, without guarding, and without rebound.   Neurologic:  Alert and  oriented x4;  grossly normal neurologically.  Impression/Plan:   RECTAL BLEEDING  PLAN:  1. TCS/? Hemorrhoid banding TODAY DISCUSSED PROCEDURE, BENEFITS, & RISKS: < 1% chance of medication reaction, ASPIRATION, bleeding, perforation, PELVIC VEIN SEPSIS, or rupture of spleen/liver REQUIRING SURGERY TO FIX IT, OR 10-20% CHANCE POLYPS < 1 CM ARE MISSED.

## 2018-03-26 NOTE — Transfer of Care (Signed)
Immediate Anesthesia Transfer of Care Note  Patient: [name]  Procedure(s) Performed: COLONOSCOPY WITH PROPOFOL (N/A ) HEMORRHOID BANDING (N/A ) POLYPECTOMY  Patient Location: PACU  Anesthesia Type:General  Level of Consciousness: awake, alert , oriented and patient cooperative  Airway & Oxygen Therapy: Patient Spontanous Breathing  Post-op Assessment: Report given to RN and Post -op Vital signs reviewed and stable  Post vital signs: Reviewed and stable  Last Vitals:  Vitals Value Taken Time  BP 122/89 03/26/2018  2:16 PM  Temp 36.9 C 03/26/2018  2:16 PM  Pulse 101 03/26/2018  2:23 PM  Resp 15 03/26/2018  2:23 PM  SpO2 100 % 03/26/2018  2:23 PM  Vitals shown include unvalidated device data.  Last Pain:  Vitals:   03/26/18 1416  TempSrc:   PainSc: 0-No pain      Patients Stated Pain Goal: 6 (83/16/74 2552)  Complications: No apparent anesthesia complications

## 2018-03-26 NOTE — Anesthesia Preprocedure Evaluation (Signed)
Anesthesia Evaluation  Patient identified by MRN, date of birth, ID band Patient awake    Reviewed: Allergy & Precautions, NPO status , Patient's Chart, lab work & pertinent test results  Airway Mallampati: II  TM Distance: >3 FB Neck ROM: Full    Dental no notable dental hx.    Pulmonary neg pulmonary ROS, Current Smoker,  Smoked today   Pulmonary exam normal breath sounds clear to auscultation       Cardiovascular Exercise Tolerance: Good hypertension, Pt. on medications negative cardio ROS Normal cardiovascular examI Rhythm:Regular Rate:Normal     Neuro/Psych negative neurological ROS  negative psych ROS   GI/Hepatic negative GI ROS, Neg liver ROS, occ GERD -none today  No current GERD meds   Endo/Other  negative endocrine ROS  Renal/GU Renal InsufficiencyRenal disease  negative genitourinary   Musculoskeletal negative musculoskeletal ROS (+)   Abdominal   Peds negative pediatric ROS (+)  Hematology negative hematology ROS (+)   Anesthesia Other Findings   Reproductive/Obstetrics negative OB ROS                             Anesthesia Physical Anesthesia Plan  ASA: II  Anesthesia Plan: General   Post-op Pain Management:    Induction: Intravenous  PONV Risk Score and Plan:   Airway Management Planned: Nasal Cannula and Simple Face Mask  Additional Equipment:   Intra-op Plan:   Post-operative Plan:   Informed Consent: I have reviewed the patients History and Physical, chart, labs and discussed the procedure including the risks, benefits and alternatives for the proposed anesthesia with the patient or authorized representative who has indicated his/her understanding and acceptance.     Dental advisory given  Plan Discussed with: CRNA  Anesthesia Plan Comments:         Anesthesia Quick Evaluation

## 2018-03-26 NOTE — Anesthesia Postprocedure Evaluation (Signed)
Anesthesia Post Note  Patient: [name]  Procedure(s) Performed: COLONOSCOPY WITH PROPOFOL (N/A ) HEMORRHOID BANDING (N/A ) POLYPECTOMY  Patient location during evaluation: PACU Anesthesia Type: General Level of consciousness: awake and alert and oriented Pain management: pain level controlled Vital Signs Assessment: post-procedure vital signs reviewed and stable Respiratory status: spontaneous breathing Cardiovascular status: stable Postop Assessment: no apparent nausea or vomiting Anesthetic complications: no     Last Vitals:  Vitals:   03/26/18 1141 03/26/18 1416  BP: (!) 138/91 122/89  Pulse: 78 (!) 101  Resp: (!) 27 16  Temp: 36.9 C 36.9 C  SpO2:  100%    Last Pain:  Vitals:   03/26/18 1416  TempSrc:   PainSc: 0-No pain                 ADAMS, AMY A

## 2018-03-26 NOTE — Op Note (Signed)
[facility] Patient Name: [name] Procedure Date: 03/26/2018 1:19 PM MRN: [id number] Date of Birth: 10/19/1980 Attending MD: Barney Drain MD, MD CSN: [id number] Age: 38 Admit Type: Outpatient Procedure:                Colonoscopy WITH COLD SNARE POLYPECTOMY Indications:              Hematochezia, Rectal pain Providers:                Barney Drain MD, MD, Rosina Lowenstein, RN, Randa Spike, Technician Referring MD:             Edwinna Areola. Hall MD Medicines:                Propofol per Anesthesia Complications:            No immediate complications. Estimated Blood Loss:     Estimated blood loss was minimal. Procedure:                Pre-Anesthesia Assessment:                           - Prior to the procedure, a History and Physical                            was performed, and patient medications and                            allergies were reviewed. The patient's tolerance of                            previous anesthesia was also reviewed. The risks                            and benefits of the procedure and the sedation                            options and risks were discussed with the patient.                            All questions were answered, and informed consent                            was obtained. Prior Anticoagulants: The patient has                            taken no previous anticoagulant or antiplatelet                            agents. ASA Grade Assessment: II - A patient with                            mild systemic disease. After reviewing the risks  and benefits, the patient was deemed in                            satisfactory condition to undergo the procedure.                            After obtaining informed consent, the colonoscope                            was passed under direct vision. Throughout the                            procedure, the patient's blood pressure, pulse, and                             oxygen saturations were monitored continuously. The                            [id number]-[id number] ([id number]) scope was introduced through                            the anus and advanced to the 10 cm into the ileum.                            The colonoscopy was somewhat difficult due to a                            tortuous colon. Successful completion of the                            procedure was aided by increasing the dose of                            sedation medication, straightening and shortening                            the scope to obtain bowel loop reduction and                            COLOWRAP. The patient tolerated the procedure                            fairly well. The quality of the bowel preparation                            was excellent. The terminal ileum, ileocecal valve,                            appendiceal orifice, and rectum were photographed. Scope In: 1:49:56 PM Scope Out: 2:08:08 PM Scope Withdrawal Time: 0 hours 15 minutes 49 seconds  Total Procedure Duration: 0 hours 18 minutes 12 seconds  Findings:      The terminal ileum appeared normal.      Two sessile polyps were found  in the rectum. The polyps were 4 to 5 mm       in size. These polyps were removed with a cold snare. Resection and       retrieval were complete.      Internal hemorrhoids were found. The hemorrhoids were small.      External hemorrhoids were found. The hemorrhoids were large.      The recto-sigmoid colon and sigmoid colon were mildly tortuous. Impression:               - The examined portion of the ileum was normal.                           - Two 4 to 5 mm polyps in the rectum, removed with                            a cold snare. Resected and retrieved.                           - SMALL Internal hemorrhoids.                           - LARGE External hemorrhoids.                           - Tortuous LEFT colon. Moderate Sedation:      Per Anesthesia  Care Recommendation:           - Patient has a contact number available for                            emergencies. The signs and symptoms of potential                            delayed complications were discussed with the                            patient. Return to normal activities tomorrow.                            Written discharge instructions were provided to the                            patient.                           - High fiber diet.                           - Continue present medications.                           - Await pathology results.                           - Repeat colonoscopy for surveillance based on  pathology results.                           - Return to GI office in 4 months. Procedure Code(s):        --- Professional ---                           [phone], Colonoscopy, flexible; with removal of                            tumor(s), polyp(s), or other lesion(s) by snare                            technique Diagnosis Code(s):        --- Professional ---                           K64.4, Residual hemorrhoidal skin tags                           K64.8, Other hemorrhoids                           K62.1, Rectal polyp                           K92.1, Melena (includes Hematochezia)                           K62.89, Other specified diseases of anus and rectum                           Q43.8, Other specified congenital malformations of                            intestine CPT copyright 2018 American Medical Association. All rights reserved. The codes documented in this report are preliminary and upon coder review may  be revised to meet current compliance requirements. Barney Drain, MD Barney Drain MD, MD 03/26/2018 2:47:11 PM This report has been signed electronically. Number of Addenda: 0

## 2018-03-27 NOTE — Telephone Encounter (Signed)
Called patient and moved appointment to July

## 2018-03-29 ENCOUNTER — Encounter (HOSPITAL_COMMUNITY): Payer: Self-pay | Admitting: Gastroenterology

## 2018-04-01 ENCOUNTER — Other Ambulatory Visit: Payer: Self-pay | Admitting: *Deleted

## 2018-04-01 DIAGNOSIS — K649 Unspecified hemorrhoids: Secondary | ICD-10-CM

## 2018-04-01 NOTE — Progress Notes (Signed)
PATIENT SCHEDULED  °

## 2018-04-01 NOTE — Progress Notes (Signed)
CC'D TO PCP °

## 2018-04-01 NOTE — Progress Notes (Signed)
PT is aware of results. Would like referral to local surgeon, since the creams or suppositories have not helped.  Forwarding to [facility].

## 2018-04-23 ENCOUNTER — Ambulatory Visit: Payer: BC Managed Care – PPO | Admitting: Nurse Practitioner

## 2018-06-25 ENCOUNTER — Ambulatory Visit: Payer: BC Managed Care – PPO | Admitting: General Surgery

## 2018-06-25 ENCOUNTER — Encounter: Payer: Self-pay | Admitting: General Surgery

## 2018-06-25 ENCOUNTER — Other Ambulatory Visit: Payer: Self-pay

## 2018-06-25 VITALS — BP 133/88 | HR 79 | Temp 99.1°F | Resp 16 | Ht 62.0 in | Wt 176.0 lb

## 2018-06-25 DIAGNOSIS — K649 Unspecified hemorrhoids: Secondary | ICD-10-CM

## 2018-06-25 NOTE — Patient Instructions (Signed)
Hemorrhoids Hemorrhoids are swollen veins that may develop:  In the butt (rectum). These are called internal hemorrhoids.  Around the opening of the butt (anus). These are called external hemorrhoids. Hemorrhoids can cause pain, itching, or bleeding. Most of the time, they do not cause serious problems. They usually get better with diet changes, lifestyle changes, and other home treatments. What are the causes? This condition may be caused by:  Having trouble pooping (constipation).  Pushing hard (straining) to poop.  Watery poop (diarrhea).  Pregnancy.  Being very overweight (obese).  Sitting for long periods of time.  Heavy lifting or other activity that causes you to strain.  Anal sex.  Riding a bike for a long period of time. What are the signs or symptoms? Symptoms of this condition include:  Pain.  Itching or soreness in the butt.  Bleeding from the butt.  Leaking poop.  Swelling in the area.  One or more lumps around the opening of your butt. How is this diagnosed? A doctor can often diagnose this condition by looking at the affected area. The doctor may also:  Do an exam that involves feeling the area with a gloved hand (digital rectal exam).  Examine the area inside your butt using a small tube (anoscope).  Order blood tests. This may be done if you have lost a lot of blood.  Have you get a test that involves looking inside the colon using a flexible tube with a camera on the end (sigmoidoscopy or colonoscopy). How is this treated? This condition can usually be treated at home. Your doctor may tell you to change what you eat, make lifestyle changes, or try home treatments. If these do not help, procedures can be done to remove the hemorrhoids or make them smaller. These may involve:  Placing rubber bands at the base of the hemorrhoids to cut off their blood supply.  Injecting medicine into the hemorrhoids to shrink them.  Shining a type of light  energy onto the hemorrhoids to cause them to fall off.  Doing surgery to remove the hemorrhoids or cut off their blood supply. Follow these instructions at home: Eating and drinking   Eat foods that have a lot of fiber in them. These include whole grains, beans, nuts, fruits, and vegetables.  Ask your doctor about taking products that have added fiber (fibersupplements).  Reduce the amount of fat in your diet. You can do this by: ? Eating low-fat dairy products. ? Eating less red meat. ? Avoiding processed foods.  Drink enough fluid to keep your pee (urine) pale yellow. Managing pain and swelling   Take a warm-water bath (sitz bath) for 20 minutes to ease pain. Do this 3-4 times a day. You may do this in a bathtub or using a portable sitz bath that fits over the toilet.  If told, put ice on the painful area. It may be helpful to use ice between your warm baths. ? Put ice in a plastic bag. ? Place a towel between your skin and the bag. ? Leave the ice on for 20 minutes, 2-3 times a day. General instructions  Take over-the-counter and prescription medicines only as told by your doctor. ? Medicated creams and medicines may be used as told.  Exercise often. Ask your doctor how much and what kind of exercise is best for you.  Go to the bathroom when you have the urge to poop. Do not wait.  Avoid pushing too hard when you poop.  Keep your   butt dry and clean. Use wet toilet paper or moist towelettes after pooping.  Do not sit on the toilet for a long time.  Keep all follow-up visits as told by your doctor. This is important. Contact a doctor if you:  Have pain and swelling that do not get better with treatment or medicine.  Have trouble pooping.  Cannot poop.  Have pain or swelling outside the area of the hemorrhoids. Get help right away if you have:  Bleeding that will not stop. Summary  Hemorrhoids are swollen veins in the butt or around the opening of the butt.   They can cause pain, itching, or bleeding.  Eat foods that have a lot of fiber in them. These include whole grains, beans, nuts, fruits, and vegetables.  Take a warm-water bath (sitz bath) for 20 minutes to ease pain. Do this 3-4 times a day. This information is not intended to replace advice given to you by your health care provider. Make sure you discuss any questions you have with your health care provider. Document Released: 10/05/2007 Document Revised: 05/17/2017 Document Reviewed: 05/17/2017 Elsevier Interactive Patient Education  2019 [employer].  Surgical Procedures for Hemorrhoids Surgical procedures can be used to treat hemorrhoids. Hemorrhoids are swollen veins that are inside the rectum (internal hemorrhoids) or around the anus (external hemorrhoids). They are caused by increased pressure in the anal area. This pressure may result from straining to have a bowel movement (constipation), diarrhea, pregnancy, obesity, anal sex, or sitting for long periods of time. Hemorrhoids can cause symptoms such as pain and bleeding. Surgery may be needed if diet changes, lifestyle changes, and other treatments do not help your symptoms. Various surgical methods may be used. Three common methods are:  Closed hemorrhoidectomy. The hemorrhoids are surgically removed, and the surgical cuts (incisions) are closed with stitches (sutures).  **Open hemorrhoidectomy. The hemorrhoids are surgically removed, but the incisions are allowed to heal without sutures.**  Stapled hemorrhoidopexy. The hemorrhoids are removed using a device that takes out a ring of excess tissue. Tell a health care provider about:  Any allergies you have.  All medicines you are taking, including vitamins, herbs, eye drops, creams, and over-the-counter medicines.  Any problems you or family members have had with anesthetic medicines.  Any blood disorders you have.  Any surgeries you have had.  Any medical conditions you  have.  Whether you are pregnant or may be pregnant. What are the risks? Generally, this is a safe procedure. However, problems may occur, including:  Infection.  Bleeding.  Allergic reactions to medicines.  Damage to other structures or organs.  Pain.  Constipation.  Difficulty passing urine.  Narrowing of the anal canal (stenosis).  Difficulty controlling bowel movements (incontinence). What happens before the procedure?  Ask your health care provider about: ? Changing or stopping your regular medicines. This is especially important if you are taking diabetes medicines or blood thinners. ? Taking medicines such as aspirin and ibuprofen. These medicines can thin your blood. Do not take these medicines before your procedure if your health care provider instructs you not to.  You may need to have a procedure to examine the inside of your colon with a scope (colonoscopy). Your health care provider may do this to make sure that there are no other causes for your bleeding or pain.  Follow instructions from your health care provider about eating or drinking restrictions.  You may be instructed to take a laxative and an enema to clean out your  colon before surgery (bowel prep). Carefully follow instructions from your health care provider about bowel prep.  Ask your health care provider how your surgical site will be marked or identified.  You may be given antibiotic medicine to help prevent infection.  Plan to have someone take you home after the procedure. What happens during the procedure?  To reduce your risk of infection: ? Your health care team will wash or sanitize their hands. ? Your skin will be washed with soap.  An IV tube will be inserted into one of your veins.  You will be given one or more of the following: ? A medicine to help you relax (sedative). ? A medicine to numb the area (local anesthetic). ? A medicine to make you fall asleep (general anesthetic). ?  A medicine that is injected into an area of your body to numb everything below the injection site (regional anesthetic).  A lubricating jelly may be placed into your rectum.  Your surgeon will insert a short scope (anoscope) into your rectum to examine the hemorrhoids.  One of the following hemorrhoid procedures will be performed. Closed Hemorrhoidectomy  Your surgeon will use surgical instruments to open the tissue around the hemorrhoids.  The veins that supply the hemorrhoids will be tied off with a suture.  The hemorrhoids will be removed.  The tissue that surrounds the hemorrhoids will be closed with sutures that your body can absorb (absorbable sutures). Open Hemorrhoidectomy  The hemorrhoids will be removed with surgical instruments.  The incisions will be left open to heal without sutures. Stapled Hemorrhoidopexy  Your surgeon will use a circular stapling device to remove the hemorrhoids.  The device will be inserted into your anus. It will remove a circular ring of tissue that includes hemorrhoid tissue and some tissue above the hemorrhoids.  The staples in the device will close the edges of removed tissue. This will cut off the blood supply to the hemorrhoids and will pull any remaining hemorrhoids back into place. Each of these procedures may vary among health care providers and hospitals. What happens after the procedure?  Your blood pressure, heart rate, breathing rate, and blood oxygen level will be monitored often until the medicines you were given have worn off.  You will be given pain medicine as needed. This information is not intended to replace advice given to you by your health care provider. Make sure you discuss any questions you have with your health care provider. Document Released: 10/23/2008 Document Revised: 06/03/2015 Document Reviewed: 03/23/2014 Elsevier Interactive Patient Education  2019 [employer].

## 2018-06-25 NOTE — Progress Notes (Signed)
[facility] History and Physical  Reason for Referral: Hemorrhoids Referring Physician: Dr. Oneida Alar   Chief Complaint    Pre-op Exam      [name] is a 38 y.o. female.  HPI:  [name] is a 38 yo who has been having some rectal bleeding and was evaluated by Dr. Oneida Alar with a colonoscopy. She was found to have two hyperplastic polyps and hemorrhoids. She says that she first noted the hemorrhoids about 1 year ago. She does have some straining and constipation on occasion with harder stools but is normally fairly regular going every other day. She says that when she noticed the bleeding it was minor and only occurred every few months.  She states that she had some itching and hygiene issues with wiping.  She does not know much about her parents health and says she was one of 10 children. Both of her parents are deceased, and she reports not going to the doctors a lot as a child. She does have some chronic kidney disease, and she is not sure what caused this but talks about recurrent UTIs, HTN, and pre-eclampsia with pregnancy. She has had 1 child and reports no issues with hemorrhoids during that pregnancy.    Past Medical History:  Diagnosis Date  . Allergy   . Chronic kidney disease 2009  . Pre-eclampsia     Past Surgical History:  Procedure Laterality Date  . COLONOSCOPY WITH PROPOFOL N/A 03/26/2018   Procedure: COLONOSCOPY WITH PROPOFOL;  Surgeon: Danie Binder, MD;  Location: [facility];  Service: Endoscopy;  Laterality: N/A;  1:15pm  . HEMORRHOID BANDING N/A 03/26/2018   Procedure: HEMORRHOID BANDING;  Surgeon: Danie Binder, MD;  Location: [facility];  Service: Endoscopy;  Laterality: N/A;  . POLYPECTOMY  03/26/2018   Procedure: POLYPECTOMY;  Surgeon: Danie Binder, MD;  Location: [facility];  Service: Endoscopy;;  rectal polyps x2  . TOOTH EXTRACTION     Minimal known about the patient's parents, both are deceased.  Family History  Problem  Relation Age of Onset  . Colon cancer Neg Hx     Social History   Tobacco Use  . Smoking status: Current Every Day Smoker    Packs/day: 0.25    Types: Cigarettes  . Smokeless tobacco: Never Used  Substance Use Topics  . Alcohol use: Yes    Comment: rare  . Drug use: Never    Medications: I have reviewed the patient's current medications. Allergies as of 06/25/2018      Reactions   Bactrim [sulfamethoxazole-trimethoprim] Swelling      Medication List       Accurate as of June 25, 2018  2:14 PM. If you have any questions, ask your nurse or doctor.        cetirizine 10 MG tablet Commonly known as: ZYRTEC Take 10 mg by mouth daily.   levonorgestrel 20 MCG/24HR IUD Commonly known as: MIRENA 1 each by Intrauterine route once.   lisinopril 20 MG tablet Commonly known as: ZESTRIL Take 20 mg by mouth daily.        ROS:  A comprehensive review of systems was negative except for: Cardiovascular: positive for HTN Genitourinary: positive for retention  Blood pressure 133/88, pulse 79, temperature 99.1 F (37.3 C), temperature source Temporal, resp. rate 16, height 5\' 2"  (1.575 m), weight 176 lb (79.8 kg), SpO2 97 %. Physical Exam Vitals signs reviewed.  Constitutional:      Appearance: Normal appearance.  HENT:     Head: Normocephalic.     Nose: Nose normal.     Mouth/Throat:     Mouth: Mucous membranes are moist.  Eyes:     Pupils: Pupils are equal, round, and reactive to light.  Neck:     Musculoskeletal: Normal range of motion.  Cardiovascular:     Rate and Rhythm: Normal rate and regular rhythm.  Pulmonary:     Effort: Pulmonary effort is normal.  Abdominal:     General: There is no distension.     Palpations: Abdomen is soft.     Tenderness: There is no abdominal tenderness.  Genitourinary:    Rectum: External hemorrhoid present.     Comments: External hemorrhoidal tags, smaller, minor protrusion of hemorrhoids with valsalva; deferred internal exam at  patient's request  Musculoskeletal: Normal range of motion.  Skin:    General: Skin is warm and dry.  Neurological:     General: No focal deficit present.     Mental Status: She is alert and oriented to person, place, and time.  Psychiatric:        Mood and Affect: Mood normal.        Behavior: Behavior normal.        Thought Content: Thought content normal.        Judgment: Judgment normal.    Results: Colonoscopy 03/2018 - -The examined portion of the ileum was normal. - Two 4 to 5 mm polyps in the rectum, removed with a cold snare. Resected and retrieved. - SMALL Internal hemorrhoids. - LARGE External hemorrhoids. - Tortuous LEFT colon.  Pathology Polyps- hyperplastic   Assessment & Plan:  [name] is a 38 y.o. female with grade I-II hemorrhoids and some minor external component/ tags. She has some minimal bleeding every few months, but also has some itching and hygiene issues. She feels like she has moisture in the area.  Hemorrhoid surgery for external hemorrhoids is very painful. The pain and discomfort that the patient is having currently will be magnified after the surgery for at least 2-3 weeks.  The patient will have feelings of constant pressure and pain in the area from the swelling and removal of the anoderm (skin around the anus). The internal hemorrhoids are not painful to remove because the same nerves are not involved, and the sensation is different, but removal of any external hemorrhoids will cause significant discomfort. They will need at least 4-6 weeks to recover from the surgery, and should not expect to be able to feel back to "normal for 6-8 weeks."    The risk of hemorrhoid surgery include bleeding, risk of infection although rare, and the risk of narrowing the anal canal if too much tissue is removed. Given this risk, it is likely that only the 2 largest hemorrhoid columns would be removed during the initial surgery.  We have also discussed the risk of  incontinence after surgery if the muscles were injured, and although this is rare that it can happen and is another reason to limit the amount of hemorrhoids removed.    We discussed that if she is hesitant about surgery that she can try sitz baths and a cotton ball for wicking away her moisture.  She wants to think about surgery, her work, and make plans. 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   All questions were answered to the satisfaction of the patient.  Virl Cagey 06/25/2018, 2:14 PM

## 2018-07-15 ENCOUNTER — Ambulatory Visit: Payer: BC Managed Care – PPO | Admitting: Nurse Practitioner

## 2019-09-18 ENCOUNTER — Other Ambulatory Visit: Payer: Self-pay

## 2019-09-18 ENCOUNTER — Other Ambulatory Visit: Payer: BC Managed Care – PPO

## 2019-09-18 DIAGNOSIS — Z20822 Contact with and (suspected) exposure to covid-19: Secondary | ICD-10-CM

## 2019-09-20 LAB — SARS-COV-2, NAA 2 DAY TAT

## 2019-09-20 LAB — NOVEL CORONAVIRUS, NAA: SARS-CoV-2, NAA: NOT DETECTED

## 2019-09-23 ENCOUNTER — Other Ambulatory Visit: Payer: Self-pay

## 2019-09-23 ENCOUNTER — Other Ambulatory Visit: Payer: BC Managed Care – PPO

## 2019-09-23 DIAGNOSIS — Z20822 Contact with and (suspected) exposure to covid-19: Secondary | ICD-10-CM

## 2019-09-25 LAB — SPECIMEN STATUS REPORT

## 2019-09-25 LAB — SARS-COV-2, NAA 2 DAY TAT

## 2019-09-25 LAB — NOVEL CORONAVIRUS, NAA: SARS-CoV-2, NAA: NOT DETECTED

## 2019-10-02 ENCOUNTER — Other Ambulatory Visit: Payer: BC Managed Care – PPO

## 2019-10-02 DIAGNOSIS — Z20822 Contact with and (suspected) exposure to covid-19: Secondary | ICD-10-CM

## 2019-10-04 LAB — NOVEL CORONAVIRUS, NAA: SARS-CoV-2, NAA: NOT DETECTED

## 2019-10-04 LAB — SARS-COV-2, NAA 2 DAY TAT

## 2019-10-09 ENCOUNTER — Other Ambulatory Visit: Payer: BC Managed Care – PPO

## 2019-10-09 DIAGNOSIS — Z20822 Contact with and (suspected) exposure to covid-19: Secondary | ICD-10-CM

## 2019-10-10 LAB — SARS-COV-2, NAA 2 DAY TAT

## 2019-10-10 LAB — NOVEL CORONAVIRUS, NAA: SARS-CoV-2, NAA: NOT DETECTED

## 2019-10-16 ENCOUNTER — Other Ambulatory Visit: Payer: BC Managed Care – PPO

## 2019-10-16 DIAGNOSIS — Z20822 Contact with and (suspected) exposure to covid-19: Secondary | ICD-10-CM

## 2019-10-17 LAB — NOVEL CORONAVIRUS, NAA: SARS-CoV-2, NAA: NOT DETECTED

## 2019-10-17 LAB — SARS-COV-2, NAA 2 DAY TAT

## 2019-10-23 ENCOUNTER — Other Ambulatory Visit: Payer: BC Managed Care – PPO

## 2019-10-23 DIAGNOSIS — Z20822 Contact with and (suspected) exposure to covid-19: Secondary | ICD-10-CM

## 2019-10-24 LAB — NOVEL CORONAVIRUS, NAA: SARS-CoV-2, NAA: NOT DETECTED

## 2019-10-24 LAB — SARS-COV-2, NAA 2 DAY TAT

## 2019-11-06 ENCOUNTER — Other Ambulatory Visit: Payer: BC Managed Care – PPO

## 2019-11-06 DIAGNOSIS — Z20822 Contact with and (suspected) exposure to covid-19: Secondary | ICD-10-CM

## 2019-11-07 LAB — NOVEL CORONAVIRUS, NAA: SARS-CoV-2, NAA: NOT DETECTED

## 2019-11-07 LAB — SARS-COV-2, NAA 2 DAY TAT

## 2019-11-13 ENCOUNTER — Other Ambulatory Visit: Payer: BC Managed Care – PPO

## 2019-11-20 ENCOUNTER — Other Ambulatory Visit: Payer: BC Managed Care – PPO

## 2019-11-20 DIAGNOSIS — Z20822 Contact with and (suspected) exposure to covid-19: Secondary | ICD-10-CM

## 2019-11-22 LAB — SARS-COV-2, NAA 2 DAY TAT

## 2019-11-22 LAB — NOVEL CORONAVIRUS, NAA: SARS-CoV-2, NAA: NOT DETECTED

## 2019-11-27 ENCOUNTER — Other Ambulatory Visit: Payer: BC Managed Care – PPO

## 2019-11-27 DIAGNOSIS — Z20822 Contact with and (suspected) exposure to covid-19: Secondary | ICD-10-CM

## 2019-11-28 LAB — NOVEL CORONAVIRUS, NAA: SARS-CoV-2, NAA: NOT DETECTED

## 2019-11-28 LAB — SARS-COV-2, NAA 2 DAY TAT

## 2019-12-02 ENCOUNTER — Other Ambulatory Visit: Payer: Self-pay | Admitting: *Deleted

## 2019-12-02 ENCOUNTER — Other Ambulatory Visit: Payer: Self-pay

## 2019-12-02 ENCOUNTER — Other Ambulatory Visit: Payer: BC Managed Care – PPO

## 2019-12-02 DIAGNOSIS — Z20822 Contact with and (suspected) exposure to covid-19: Secondary | ICD-10-CM

## 2019-12-03 LAB — SARS-COV-2, NAA 2 DAY TAT

## 2019-12-03 LAB — NOVEL CORONAVIRUS, NAA: SARS-CoV-2, NAA: NOT DETECTED

## 2019-12-03 LAB — SPECIMEN STATUS REPORT

## 2019-12-11 ENCOUNTER — Other Ambulatory Visit: Payer: BC Managed Care – PPO

## 2019-12-11 DIAGNOSIS — Z20822 Contact with and (suspected) exposure to covid-19: Secondary | ICD-10-CM

## 2019-12-12 LAB — SARS-COV-2, NAA 2 DAY TAT

## 2019-12-12 LAB — NOVEL CORONAVIRUS, NAA: SARS-CoV-2, NAA: NOT DETECTED

## 2019-12-18 ENCOUNTER — Other Ambulatory Visit: Payer: BC Managed Care – PPO

## 2019-12-18 DIAGNOSIS — Z20822 Contact with and (suspected) exposure to covid-19: Secondary | ICD-10-CM

## 2019-12-20 LAB — SARS-COV-2, NAA 2 DAY TAT

## 2019-12-20 LAB — NOVEL CORONAVIRUS, NAA: SARS-CoV-2, NAA: NOT DETECTED

## 2019-12-25 ENCOUNTER — Other Ambulatory Visit: Payer: BC Managed Care – PPO

## 2019-12-25 DIAGNOSIS — Z20822 Contact with and (suspected) exposure to covid-19: Secondary | ICD-10-CM

## 2019-12-27 LAB — NOVEL CORONAVIRUS, NAA: SARS-CoV-2, NAA: NOT DETECTED

## 2019-12-27 LAB — SARS-COV-2, NAA 2 DAY TAT

## 2020-01-01 ENCOUNTER — Other Ambulatory Visit: Payer: BC Managed Care – PPO

## 2020-01-01 DIAGNOSIS — Z20822 Contact with and (suspected) exposure to covid-19: Secondary | ICD-10-CM

## 2020-01-03 LAB — NOVEL CORONAVIRUS, NAA: SARS-CoV-2, NAA: NOT DETECTED

## 2020-01-03 LAB — SARS-COV-2, NAA 2 DAY TAT

## 2020-01-08 ENCOUNTER — Other Ambulatory Visit: Payer: BC Managed Care – PPO

## 2020-01-08 DIAGNOSIS — Z20822 Contact with and (suspected) exposure to covid-19: Secondary | ICD-10-CM

## 2020-01-11 LAB — NOVEL CORONAVIRUS, NAA: SARS-CoV-2, NAA: NOT DETECTED

## 2020-01-11 LAB — SARS-COV-2, NAA 2 DAY TAT

## 2020-01-15 ENCOUNTER — Other Ambulatory Visit: Payer: BC Managed Care – PPO

## 2020-01-22 ENCOUNTER — Other Ambulatory Visit: Payer: Self-pay

## 2020-01-22 DIAGNOSIS — Z20822 Contact with and (suspected) exposure to covid-19: Secondary | ICD-10-CM

## 2020-01-25 LAB — NOVEL CORONAVIRUS, NAA: SARS-CoV-2, NAA: NOT DETECTED

## 2020-01-29 ENCOUNTER — Other Ambulatory Visit: Payer: Self-pay

## 2020-01-29 DIAGNOSIS — Z20822 Contact with and (suspected) exposure to covid-19: Secondary | ICD-10-CM

## 2020-01-31 LAB — NOVEL CORONAVIRUS, NAA: SARS-CoV-2, NAA: NOT DETECTED

## 2020-01-31 LAB — SARS-COV-2, NAA 2 DAY TAT

## 2020-02-05 ENCOUNTER — Other Ambulatory Visit: Payer: Self-pay

## 2020-02-05 DIAGNOSIS — Z20822 Contact with and (suspected) exposure to covid-19: Secondary | ICD-10-CM

## 2020-02-06 LAB — SARS-COV-2, NAA 2 DAY TAT

## 2020-02-06 LAB — NOVEL CORONAVIRUS, NAA: SARS-CoV-2, NAA: NOT DETECTED

## 2020-02-12 ENCOUNTER — Other Ambulatory Visit: Payer: Self-pay

## 2020-02-12 DIAGNOSIS — Z20822 Contact with and (suspected) exposure to covid-19: Secondary | ICD-10-CM

## 2020-02-13 LAB — SPECIMEN STATUS REPORT

## 2020-02-13 LAB — NOVEL CORONAVIRUS, NAA: SARS-CoV-2, NAA: NOT DETECTED

## 2020-02-13 LAB — SARS-COV-2, NAA 2 DAY TAT

## 2020-02-19 ENCOUNTER — Other Ambulatory Visit: Payer: Self-pay

## 2020-02-19 DIAGNOSIS — Z20822 Contact with and (suspected) exposure to covid-19: Secondary | ICD-10-CM

## 2020-02-20 LAB — SARS-COV-2, NAA 2 DAY TAT

## 2020-02-20 LAB — NOVEL CORONAVIRUS, NAA: SARS-CoV-2, NAA: NOT DETECTED

## 2020-02-26 ENCOUNTER — Other Ambulatory Visit: Payer: Self-pay

## 2020-02-26 DIAGNOSIS — Z20822 Contact with and (suspected) exposure to covid-19: Secondary | ICD-10-CM

## 2020-02-27 LAB — SARS-COV-2, NAA 2 DAY TAT

## 2020-02-27 LAB — NOVEL CORONAVIRUS, NAA: SARS-CoV-2, NAA: NOT DETECTED

## 2020-03-04 ENCOUNTER — Other Ambulatory Visit: Payer: Self-pay

## 2020-03-11 ENCOUNTER — Other Ambulatory Visit: Payer: Self-pay

## 2020-03-11 DIAGNOSIS — Z20822 Contact with and (suspected) exposure to covid-19: Secondary | ICD-10-CM

## 2020-03-12 LAB — SARS-COV-2, NAA 2 DAY TAT

## 2020-03-12 LAB — NOVEL CORONAVIRUS, NAA: SARS-CoV-2, NAA: NOT DETECTED

## 2020-09-08 ENCOUNTER — Ambulatory Visit: Payer: Self-pay | Admitting: Internal Medicine

## 2021-01-23 NOTE — Progress Notes (Deleted)
Referring Provider: Celene Squibb, MD Primary Care Physician:  Celene Squibb, MD Primary GI Physician: Dr. Abbey Chatters  No chief complaint on file.   HPI:   [name] is a 41 y.o. female presenting today with chief complaint of rectal bleeding ***  Colonoscopy 03/26/2018: Normal examined TI, two 4-5 mm polyps in the rectum resected and retrieved, small internal hemorrhoids, large external hemorrhoids, tortuous left colon.  Pathology with hyperplastic polyp.  Recommended repeat colonoscopy at age 19.  Recommended supportive measures for management of hemorrhoids and referral to general surgery if persistent hemorrhoid symptoms.  Ultimately, patient was referred to general surgery.  She did see general surgery in June 2020 and opted to think further about surgical intervention.  Reviewed most recent labs in Care Everywhere dated 12/23/2020 with hemoglobin of 14.6.        Past Medical History:  Diagnosis Date   Allergy    Chronic kidney disease 2009   Pre-eclampsia     Past Surgical History:  Procedure Laterality Date   COLONOSCOPY WITH PROPOFOL N/A 03/26/2018   Procedure: COLONOSCOPY WITH PROPOFOL;  Surgeon: Danie Binder, MD;  [facility];  Service: Endoscopy;  Laterality: N/A;  1:15pm   HEMORRHOID BANDING N/A 03/26/2018   Procedure: HEMORRHOID BANDING;  Surgeon: Danie Binder, MD;  [facility];  Service: Endoscopy;  Laterality: N/A;   POLYPECTOMY  03/26/2018   Procedure: POLYPECTOMY;  Surgeon: Danie Binder, MD;  [facility];  Service: Endoscopy;;  rectal polyps x2   TOOTH EXTRACTION      Current Outpatient Medications  Medication Sig Dispense Refill   cetirizine (ZYRTEC) 10 MG tablet Take 10 mg by mouth daily.     levonorgestrel (MIRENA) 20 MCG/24HR IUD 1 each by Intrauterine route once.     lisinopril (PRINIVIL,ZESTRIL) 20 MG tablet Take 20 mg by mouth daily.      No current facility-administered medications for this visit.     Allergies as of 01/24/2021 - Review Complete 06/25/2018  Allergen Reaction Noted   Bactrim [sulfamethoxazole-trimethoprim] Swelling 12/11/2017    Family History  Problem Relation Age of Onset   Colon cancer Neg Hx     Social History   Socioeconomic History   Marital status: Divorced    Spouse name: Not on file   Number of children: Not on file   Years of education: Not on file   Highest education level: Not on file  Occupational History   Not on file  Tobacco Use   Smoking status: Every Day    Packs/day: 0.25    Types: Cigarettes   Smokeless tobacco: Never  Substance and Sexual Activity   Alcohol use: Yes    Comment: rare   Drug use: Never   Sexual activity: Not on file  Other Topics Concern   Not on file  Social History Narrative   Not on file   Social Determinants of Health   Financial Resource Strain: Not on file  Food Insecurity: Not on file  Transportation Needs: Not on file  Physical Activity: Not on file  Stress: Not on file  Social Connections: Not on file    Review of Systems: Gen: Denies fever, chills, anorexia. Denies fatigue, weakness, weight loss.  CV: Denies chest pain, palpitations, syncope, peripheral edema, and claudication. Resp: Denies dyspnea at rest, cough, wheezing, coughing up blood, and pleurisy. GI: Denies vomiting blood, jaundice, and fecal incontinence.   Denies dysphagia or odynophagia. Derm: Denies rash,  itching, dry skin Psych: Denies depression, anxiety, memory loss, confusion. No homicidal or suicidal ideation.  Heme: Denies bruising, bleeding, and enlarged lymph nodes.  Physical Exam: There were no vitals taken for this visit. General:   Alert and oriented. No distress noted. Pleasant and cooperative.  Head:  Normocephalic and atraumatic. Eyes:  Conjuctiva clear without scleral icterus. Mouth:  Oral mucosa pink and moist. Good dentition. No lesions. Heart:  S1, S2 present without murmurs appreciated. Lungs:  Clear to  auscultation bilaterally. No wheezes, rales, or rhonchi. No distress.  Abdomen:  +BS, soft, non-tender and non-distended. No rebound or guarding. No HSM or masses noted. Msk:  Symmetrical without gross deformities. Normal posture. Extremities:  Without edema. Neurologic:  Alert and  oriented x4 Psych:  Alert and cooperative. Normal mood and affect.

## 2021-01-24 ENCOUNTER — Ambulatory Visit: Payer: Self-pay | Admitting: Gastroenterology

## 2021-01-24 ENCOUNTER — Encounter: Payer: Self-pay | Admitting: Internal Medicine

## 2021-12-15 ENCOUNTER — Other Ambulatory Visit (HOSPITAL_COMMUNITY): Payer: Self-pay | Admitting: Nephrology

## 2021-12-15 DIAGNOSIS — N184 Chronic kidney disease, stage 4 (severe): Secondary | ICD-10-CM

## 2021-12-15 DIAGNOSIS — I129 Hypertensive chronic kidney disease with stage 1 through stage 4 chronic kidney disease, or unspecified chronic kidney disease: Secondary | ICD-10-CM

## 2021-12-15 DIAGNOSIS — R809 Proteinuria, unspecified: Secondary | ICD-10-CM

## 2021-12-15 DIAGNOSIS — E875 Hyperkalemia: Secondary | ICD-10-CM

## 2021-12-28 ENCOUNTER — Ambulatory Visit (HOSPITAL_COMMUNITY): Payer: BC Managed Care – PPO

## 2021-12-28 ENCOUNTER — Encounter (HOSPITAL_COMMUNITY): Payer: Self-pay

## 2022-02-15 ENCOUNTER — Other Ambulatory Visit (HOSPITAL_COMMUNITY): Payer: Self-pay | Admitting: Nephrology

## 2022-02-15 DIAGNOSIS — N184 Chronic kidney disease, stage 4 (severe): Secondary | ICD-10-CM

## 2022-02-15 DIAGNOSIS — I129 Hypertensive chronic kidney disease with stage 1 through stage 4 chronic kidney disease, or unspecified chronic kidney disease: Secondary | ICD-10-CM

## 2022-02-22 ENCOUNTER — Ambulatory Visit (HOSPITAL_COMMUNITY)
Admission: RE | Admit: 2022-02-22 | Discharge: 2022-02-22 | Disposition: A | Discharge status: Home / Self Care | Payer: BC Managed Care – PPO | Source: Ambulatory Visit | Attending: Nephrology | Admitting: Nephrology

## 2022-02-22 DIAGNOSIS — N184 Chronic kidney disease, stage 4 (severe): Secondary | ICD-10-CM

## 2022-02-22 DIAGNOSIS — I129 Hypertensive chronic kidney disease with stage 1 through stage 4 chronic kidney disease, or unspecified chronic kidney disease: Secondary | ICD-10-CM | POA: Diagnosis present

## 2022-02-22 DIAGNOSIS — R809 Proteinuria, unspecified: Secondary | ICD-10-CM | POA: Diagnosis present

## 2022-02-22 DIAGNOSIS — E875 Hyperkalemia: Secondary | ICD-10-CM | POA: Diagnosis present

## 2022-03-14 ENCOUNTER — Encounter (HOSPITAL_COMMUNITY): Payer: Self-pay

## 2022-03-14 ENCOUNTER — Ambulatory Visit (HOSPITAL_COMMUNITY): Payer: BC Managed Care – PPO

## 2022-03-15 ENCOUNTER — Other Ambulatory Visit (HOSPITAL_COMMUNITY): Payer: Self-pay | Admitting: Nephrology

## 2022-03-15 DIAGNOSIS — N2889 Other specified disorders of kidney and ureter: Secondary | ICD-10-CM

## 2022-03-15 DIAGNOSIS — N051 Unspecified nephritic syndrome with focal and segmental glomerular lesions: Secondary | ICD-10-CM

## 2022-03-15 DIAGNOSIS — N184 Chronic kidney disease, stage 4 (severe): Secondary | ICD-10-CM

## 2022-03-15 DIAGNOSIS — I129 Hypertensive chronic kidney disease with stage 1 through stage 4 chronic kidney disease, or unspecified chronic kidney disease: Secondary | ICD-10-CM

## 2022-03-28 ENCOUNTER — Ambulatory Visit (HOSPITAL_COMMUNITY)
Admission: RE | Admit: 2022-03-28 | Discharge: 2022-03-28 | Disposition: A | Discharge status: Home / Self Care | Payer: BC Managed Care – PPO | Source: Ambulatory Visit | Attending: Nephrology | Admitting: Nephrology

## 2022-03-28 DIAGNOSIS — I129 Hypertensive chronic kidney disease with stage 1 through stage 4 chronic kidney disease, or unspecified chronic kidney disease: Secondary | ICD-10-CM | POA: Insufficient documentation

## 2022-03-28 DIAGNOSIS — N2889 Other specified disorders of kidney and ureter: Secondary | ICD-10-CM | POA: Diagnosis present

## 2022-03-28 DIAGNOSIS — N051 Unspecified nephritic syndrome with focal and segmental glomerular lesions: Secondary | ICD-10-CM | POA: Diagnosis present

## 2022-03-28 DIAGNOSIS — N184 Chronic kidney disease, stage 4 (severe): Secondary | ICD-10-CM

## 2022-04-12 ENCOUNTER — Encounter: Payer: Self-pay | Admitting: Pulmonary Disease

## 2022-04-12 ENCOUNTER — Ambulatory Visit (INDEPENDENT_AMBULATORY_CARE_PROVIDER_SITE_OTHER): Payer: BC Managed Care – PPO | Admitting: Pulmonary Disease

## 2022-04-12 VITALS — BP 142/86 | HR 74 | Ht 62.0 in | Wt 178.8 lb

## 2022-04-12 DIAGNOSIS — F17200 Nicotine dependence, unspecified, uncomplicated: Secondary | ICD-10-CM | POA: Diagnosis not present

## 2022-04-12 DIAGNOSIS — R0683 Snoring: Secondary | ICD-10-CM

## 2022-04-12 NOTE — Patient Instructions (Signed)
X home sleep test °

## 2022-04-12 NOTE — Progress Notes (Signed)
Subjective:    Patient ID: [name], female    DOB: [birth year], 42 y.o.   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 Hypertension  Loud snoring has been noted by her boyfriend.  She reports occasional gasping episodes that awoken her from sleep.  She reports excessive daytime tiredness and restless and nonrefreshing sleep. Epworth sleepiness score is 3. Bedtime is between 9 and 10 PM, sleep latency about an hour.  She sleeps on her tummy and turns over to her side, reports a few nocturnal awakenings and is out of bed at 5:15 AM feeling tired with dryness of mouth but denies headaches.  Many times she has to hit the snooze button and can be as late as 545 before waking up. On weekends she will stay in bed until 8 AM and nap on the couch for about 2 hours.  Naps are somewhat refreshing. She has gained 20 pounds over the last 2 years and is trying to lose weight Her dad and brother have OSA  There is no history suggestive of cataplexy, sleep paralysis or parasomnias  She smokes about half pack per day, less than 15 pack years, she quit twice as not willing to commit to quitting due to the weight gain  Past Medical History:  Diagnosis Date   Allergy    Chronic kidney disease 2009   Pre-eclampsia    Past Surgical History:  Procedure Laterality Date   COLONOSCOPY WITH PROPOFOL N/A 03/26/2018   Procedure: COLONOSCOPY WITH PROPOFOL;  Surgeon: Danie Binder, MD;  [facility];  Service: Endoscopy;  Laterality: N/A;  1:15pm   HEMORRHOID BANDING N/A 03/26/2018   Procedure: HEMORRHOID BANDING;  Surgeon: Danie Binder, MD;  [facility];  Service: Endoscopy;  Laterality: N/A;   POLYPECTOMY  03/26/2018   Procedure: POLYPECTOMY;  Surgeon: Danie Binder, MD;  [facility];  Service: Endoscopy;;  rectal  polyps x2   TOOTH EXTRACTION      Allergies  Allergen Reactions   Bactrim [Sulfamethoxazole-Trimethoprim] Swelling    Social History   Socioeconomic History   Marital status: Divorced    Spouse name: Not on file   Number of children: Not on file   Years of education: Not on file   Highest education level: Not on file  Occupational History   Not on file  Tobacco Use   Smoking status: Every Day    Packs/day: .25    Types: Cigarettes   Smokeless tobacco: Never  Substance and Sexual Activity   Alcohol use: Yes    Comment: rare   Drug use: Never   Sexual activity: Not on file  Other Topics Concern   Not on file  Social History Narrative   Not on file   Social Determinants of Health   Financial Resource Strain: Not on file  Food Insecurity: Not on file  Transportation Needs: Not on file  Physical Activity: Not on file  Stress: Not on file  Social Connections: Not on file  Intimate Partner Violence: Not on file    Family History  Problem Relation Age of Onset   Colon cancer Neg Hx       Review of Systems Constitutional: negative for anorexia, fevers and sweats  Eyes: negative for irritation, redness and visual disturbance  Ears, nose, mouth, throat, and face: negative for earaches, epistaxis,  nasal congestion and sore throat  Respiratory: negative for cough, dyspnea on exertion, sputum and wheezing  Cardiovascular: negative for chest pain, dyspnea, lower extremity edema, orthopnea, palpitations and syncope  Gastrointestinal: negative for abdominal pain, constipation, diarrhea, melena, nausea and vomiting  Genitourinary:negative for dysuria, frequency and hematuria  Hematologic/lymphatic: negative for bleeding, easy bruising and lymphadenopathy  Musculoskeletal:negative for arthralgias, muscle weakness and stiff joints  Neurological: negative for coordination problems, gait problems, headaches and weakness  Endocrine: negative for diabetic symptoms including  polydipsia, polyuria and weight loss     Objective:   Physical Exam  Gen. Pleasant, obese, in no distress, normal affect ENT - no pallor,icterus, no post nasal drip, class 2-3 airway Neck: No JVD, no thyromegaly, no carotid bruits Lungs: no use of accessory muscles, no dullness to percussion, decreased without rales or rhonchi  Cardiovascular: Rhythm regular, heart sounds  normal, no murmurs or gallops, no peripheral edema Abdomen: soft and non-tender, no hepatosplenomegaly, BS normal. Musculoskeletal: No deformities, no cyanosis or clubbing Neuro:  alert, non focal, no tremors       Assessment & Plan:   Snoring and nonrefreshing sleep - Given excessive daytime somnolence, narrow pharyngeal exam,  & loud snoring, obstructive sleep apnea is very likely & an overnight polysomnogram will be scheduled as a home study. The pathophysiology of obstructive sleep apnea , it's cardiovascular consequences & modes of treatment including CPAP were discused with the patient in detail & they evidenced understanding.  Pretest probability is intermediate.  She would be willing to use a CPAP if AHI more than 15   Obesity  -concerned that this is impacting hypertension as well as kidney disease.  She is awaiting referral from nephrology to weight loss program and is even willing to consider bariatric surgery.  She was approved for [facility] but is unable to obtain it

## 2022-04-12 NOTE — Assessment & Plan Note (Signed)
Smoking cessation emphasized but she is not willing to commit to quit attempt due to concern for weight gain

## 2022-04-25 ENCOUNTER — Institutional Professional Consult (permissible substitution): Payer: BC Managed Care – PPO | Admitting: Pulmonary Disease

## 2022-05-30 ENCOUNTER — Encounter (INDEPENDENT_AMBULATORY_CARE_PROVIDER_SITE_OTHER): Payer: BC Managed Care – PPO

## 2022-05-30 ENCOUNTER — Telehealth: Payer: Self-pay | Admitting: Pulmonary Disease

## 2022-05-30 DIAGNOSIS — G4733 Obstructive sleep apnea (adult) (pediatric): Secondary | ICD-10-CM

## 2022-05-30 DIAGNOSIS — R0683 Snoring: Secondary | ICD-10-CM

## 2022-05-30 NOTE — Telephone Encounter (Signed)
HST showed mild  OSA with AHI 10/ hr & low sat of 88%  Options include weight loss alone but she says she is symptomatic, may offer her dental appliance or CPAP therapy. She had indicated that she did not want CPAP therapy, we can refer her to a dentist for dental appliance if needed. If she wants to discuss further we can schedule office visit in 2 to 3 months and give her an opportunity for weight loss

## 2022-06-19 NOTE — Telephone Encounter (Signed)
ATC pt LVM for her to call office back.  

## 2022-06-20 NOTE — Telephone Encounter (Signed)
Pt returning missed call. 

## 2022-06-20 NOTE — Telephone Encounter (Signed)
Patient returning call regarding sleep study results. Patient would also like to discuss weight loss options with provider (possible gastric sleeve)  Please call and advise patient. [phone]

## 2022-06-21 NOTE — Telephone Encounter (Signed)
Called and spoke w/ pt she verbalized that she has stage 4 kidney disease nephrologist wants to refer pt to a weight loss clinic for possible gastric sleeve due to her not being able eat certain foods. She is wanting to get RA recommendations. Please advise?

## 2022-06-21 NOTE — Telephone Encounter (Signed)
Called and spoke w/ pt she verbalized understanding of RA's message - states that she is going to discuss the referral w/ nephrologist at next OV and will f/u w/ our clinic afterwards.

## 2023-05-25 ENCOUNTER — Other Ambulatory Visit: Payer: Self-pay

## 2023-05-25 ENCOUNTER — Encounter (HOSPITAL_COMMUNITY): Payer: Self-pay

## 2023-05-25 ENCOUNTER — Emergency Department (HOSPITAL_COMMUNITY)
Admission: EM | Admit: 2023-05-25 | Discharge: 2023-05-25 | Disposition: A | Discharge status: Home / Self Care | Attending: Emergency Medicine | Admitting: Emergency Medicine

## 2023-05-25 DIAGNOSIS — R002 Palpitations: Secondary | ICD-10-CM | POA: Insufficient documentation

## 2023-05-25 DIAGNOSIS — I129 Hypertensive chronic kidney disease with stage 1 through stage 4 chronic kidney disease, or unspecified chronic kidney disease: Secondary | ICD-10-CM | POA: Diagnosis not present

## 2023-05-25 DIAGNOSIS — R0602 Shortness of breath: Secondary | ICD-10-CM | POA: Insufficient documentation

## 2023-05-25 DIAGNOSIS — N189 Chronic kidney disease, unspecified: Secondary | ICD-10-CM | POA: Diagnosis not present

## 2023-05-25 DIAGNOSIS — Z79899 Other long term (current) drug therapy: Secondary | ICD-10-CM | POA: Insufficient documentation

## 2023-05-25 LAB — MAGNESIUM: Magnesium: 1.7 mg/dL (ref 1.7–2.4)

## 2023-05-25 LAB — COMPREHENSIVE METABOLIC PANEL WITH GFR
ALT: 16 U/L (ref 0–44)
AST: 14 U/L — ABNORMAL LOW (ref 15–41)
Albumin: 3.7 g/dL (ref 3.5–5.0)
Alkaline Phosphatase: 50 U/L (ref 38–126)
Anion gap: 11 (ref 5–15)
BUN: 34 mg/dL — ABNORMAL HIGH (ref 6–20)
CO2: 22 mmol/L (ref 22–32)
Calcium: 10 mg/dL (ref 8.9–10.3)
Chloride: 100 mmol/L (ref 98–111)
Creatinine, Ser: 2.93 mg/dL — ABNORMAL HIGH (ref 0.44–1.00)
GFR, Estimated: 20 mL/min — ABNORMAL LOW (ref >60)
Glucose, Bld: 82 mg/dL (ref 70–99)
Potassium: 4.6 mmol/L (ref 3.5–5.1)
Sodium: 133 mmol/L — ABNORMAL LOW (ref 135–145)
Total Bilirubin: 0.8 mg/dL (ref 0.0–1.2)
Total Protein: 6.9 g/dL (ref 6.5–8.1)

## 2023-05-25 LAB — CBC
HCT: 40.3 % (ref 36.0–46.0)
Hemoglobin: 13.7 g/dL (ref 12.0–15.0)
MCH: 31.7 pg (ref 26.0–34.0)
MCHC: 34 g/dL (ref 30.0–36.0)
MCV: 93.3 fL (ref 80.0–100.0)
Platelets: 231 10*3/uL (ref 150–400)
RBC: 4.32 MIL/uL (ref 3.87–5.11)
RDW: 11.9 % (ref 11.5–15.5)
WBC: 9.4 10*3/uL (ref 4.0–10.5)
nRBC: 0 % (ref 0.0–0.2)

## 2023-05-25 NOTE — Discharge Instructions (Addendum)
 Your testing today is normal, your kidney function has not changed, your potassium and magnesium levels are normal.  It is unclear why you are having the feeling of the palpitations but rest assured that there is not appear to be anything pathological that we have found.  I do want you to follow-up with the heart doctor to have some Holter monitor testing, see the phone number above for our local cardiologist.  Please call them first thing Monday morning to arrange an outpatient follow-up  In the meantime return to the ER immediately for severe worsening symptoms.  Please avoid stimulants such as over-the-counter cough or cold medicines, allergy medicines or caffeine

## 2023-05-25 NOTE — ED Triage Notes (Signed)
 Pt has been having what she thinks is heart palpitations for the last year . Pt went to UC and was sent her for further eval. No other symptoms

## 2023-05-25 NOTE — ED Provider Notes (Signed)
 [place] [facility] AT [facility] Provider Note   CSN: [id number] Arrival date & time: 05/25/23  1444     History  Chief Complaint  Patient presents with   Irregular Heart Beat    [name] is a 43 y.o. female.  HPI   This patient is a 43 year old female, she has a history of chronic kidney disease, has 1 functioning kidney that is not doing well but is followed by nephrology and is treated for hypertension with both carvedilol and olmesartan.  She was recently diagnosed with hyperkalemia with a potassium of 5.5 and was placed on a diuretic.  She reports that recently she started to have some intermittent palpitations and feels like she gets a little short of breath on exertion.  This only happens occasionally and is not consistent or reproducible.  She denies having chest pain fever swelling of the legs dysuria diarrhea or any other symptoms.  No formal history of cardiac arrhythmia  Home Medications Prior to Admission medications   Medication Sig Start Date End Date Taking? Authorizing Provider  calcitRIOL (ROCALTROL) 0.25 MCG capsule Take 0.25 mcg by mouth 3 (three) times a week.    [provider]  carvedilol (COREG) 6.25 MG tablet Take 6.25 mg by mouth 2 (two) times daily.    [provider]  furosemide (LASIX) 20 MG tablet furosemide 20 mg tablet  TAKE 1 TABLET (20 MG TOTAL) BY MOUTH ONE TIME EACH DAY    [provider]      Allergies    Bactrim [sulfamethoxazole-trimethoprim]    Review of Systems   Review of Systems  All other systems reviewed and are negative.   Physical Exam Updated Vital Signs BP (!) 149/96   Pulse 64   Temp 98.7 F (37.1 C)   Resp 10   Ht 1.575 m (5\' 2" )   Wt 70.3 kg   SpO2 100%   BMI 28.35 kg/m  Physical Exam Vitals and nursing note reviewed.  Constitutional:      General: She is not in acute distress.    Appearance: She is well-developed.  HENT:     Head: Normocephalic and  atraumatic.     Mouth/Throat:     Pharynx: No oropharyngeal exudate.  Eyes:     General: No scleral icterus.       Right eye: No discharge.        Left eye: No discharge.     Conjunctiva/sclera: Conjunctivae normal.     Pupils: Pupils are equal, round, and reactive to light.  Neck:     Thyroid: No thyromegaly.     Vascular: No JVD.  Cardiovascular:     Rate and Rhythm: Normal rate and regular rhythm.     Heart sounds: Normal heart sounds. No murmur heard.    No friction rub. No gallop.  Pulmonary:     Effort: Pulmonary effort is normal. No respiratory distress.     Breath sounds: Normal breath sounds. No wheezing or rales.  Abdominal:     General: Bowel sounds are normal. There is no distension.     Palpations: Abdomen is soft. There is no mass.     Tenderness: There is no abdominal tenderness.  Musculoskeletal:        General: No tenderness. Normal range of motion.     Cervical back: Normal range of motion and neck supple.     Right lower leg: No edema.     Left lower leg: No edema.  Lymphadenopathy:     Cervical: No cervical adenopathy.  Skin:    General: Skin is warm and dry.     Findings: No erythema or rash.  Neurological:     Mental Status: She is alert.     Coordination: Coordination normal.  Psychiatric:        Behavior: Behavior normal.     ED Results / Procedures / Treatments   Labs (all labs ordered are listed, but only abnormal results are displayed) Labs Reviewed  COMPREHENSIVE METABOLIC PANEL WITH GFR - Abnormal; Notable for the following components:      Result Value   Sodium 133 (*)    BUN 34 (*)    Creatinine, Ser 2.93 (*)    AST 14 (*)    GFR, Estimated 20 (*)    All other components within normal limits  MAGNESIUM  CBC    EKG EKG Interpretation Date/Time:  Friday May 25 2023 17:42:08 EDT Ventricular Rate:  74 PR Interval:  147 QRS Duration:  98 QT Interval:  370 QTC Calculation: 411 R Axis:   67  Text Interpretation: Sinus rhythm  RSR' in V1 or V2, right VCD or RVH Confirmed by Early Glisson ([id number]) on 05/25/2023 6:39:45 PM  Radiology No results found.  Procedures Procedures    Medications Ordered in ED Medications - No data to display  ED Course/ Medical Decision Making/ A&P                                 Medical Decision Making Amount and/or Complexity of Data Reviewed Labs: ordered.    This patient presents to the ED for concern of palpitations, this involves an extensive number of treatment options, and is a complaint that carries with it a high risk of complications and morbidity.  The differential diagnosis includes electrolyte abnormalities, thyroid dysfunction seems less likely as she recent had her thyroid testing and it was unremarkable, she does not drink a significant amount of energy drinks or caffeine only 1 can of diet soda per day.  She has no cardiac symptoms and only has occasional shortness of breath with exertion.  She has started Ozempic recently and is lost about 30 pounds   Co morbidities that complicate the patient evaluation  Obesity, hypertension, chronic kidney disease   Additional history obtained:  Additional history obtained from medical record External records from outside source obtained and reviewed including prior lab workup, the patient is able to go into her medical record and share her recent labs with me.  She had a potassium of 5.5 with a creatinine of 2.93, this was performed May 14 approximately 2 days ago   Lab Tests:  I Ordered, and personally interpreted labs.  The pertinent results include: Unremarkable CBC and metabolic panel, creatinine is at baseline   Cardiac Monitoring: / EKG:  The patient was maintained on a cardiac monitor.  I personally viewed and interpreted the cardiac monitored which showed an underlying rhythm of: Normal sinus rhythm, no ectopy seen   Problem List / ED Course / Critical interventions / Medication management  Patient is  well-appearing, no signs of ectopy or arrhythmia, vital signs reflect mild hypertension but no other significant findings, stable for discharge to follow-up with cardiology I have discussed with the patient at the bedside the results, and the meaning of these results.  They have had opportunity to ask questions,  expressed their understanding to the need for  follow-up with primary care physician I have reviewed the patients home medicines and have made adjustments as needed   Consultations Obtained:  Outpatient cardiology follow-up given  Social Determinants of Health:  Tobacco use considered admission but the patient has no pathological findings, stable for discharge, expressed her understanding   Test / Admission - Considered:  Considered admission but no arrhytnmia, seen, low risk.         Final Clinical Impression(s) / ED Diagnoses Final diagnoses:  Palpitations    Rx / DC Orders ED Discharge Orders     None         Early Glisson, MD 05/25/23 1842

## 2023-05-25 NOTE — ED Notes (Signed)
Pt ambulatory to room 7 

## 2024-02-08 ENCOUNTER — Encounter: Payer: Self-pay | Admitting: Gastroenterology
